# Patient Record
Sex: Female | Born: 1995 | Race: Black or African American | Hispanic: No | Marital: Single | Smoking: Former smoker
Health system: Southern US, Community
[De-identification: ages and names within clinical notes are randomized; demographics above are authoritative.]

## PROBLEM LIST (undated history)

## (undated) DIAGNOSIS — N760 Acute vaginitis: Secondary | ICD-10-CM

## (undated) DIAGNOSIS — B9689 Other specified bacterial agents as the cause of diseases classified elsewhere: Secondary | ICD-10-CM

## (undated) HISTORY — PX: NO PAST SURGERIES: SHX2092

---

## 2015-11-04 ENCOUNTER — Emergency Department (HOSPITAL_COMMUNITY): Payer: BLUE CROSS/BLUE SHIELD

## 2015-11-04 ENCOUNTER — Encounter (HOSPITAL_COMMUNITY): Payer: Self-pay | Admitting: Emergency Medicine

## 2015-11-04 ENCOUNTER — Emergency Department (HOSPITAL_COMMUNITY)
Admission: EM | Admit: 2015-11-04 | Discharge: 2015-11-04 | Disposition: A | Discharge status: Home / Self Care | Payer: BLUE CROSS/BLUE SHIELD | Attending: Emergency Medicine | Admitting: Emergency Medicine

## 2015-11-04 DIAGNOSIS — S8012XD Contusion of left lower leg, subsequent encounter: Secondary | ICD-10-CM

## 2015-11-04 DIAGNOSIS — F17200 Nicotine dependence, unspecified, uncomplicated: Secondary | ICD-10-CM | POA: Diagnosis not present

## 2015-11-04 DIAGNOSIS — W501XXD Accidental kick by another person, subsequent encounter: Secondary | ICD-10-CM | POA: Insufficient documentation

## 2015-11-04 MED ORDER — OXYCODONE-ACETAMINOPHEN 5-325 MG PO TABS: 1.0000 | ORAL_TABLET | Freq: Three times a day (TID) | ORAL | Status: DC | PRN

## 2015-11-04 MED ORDER — OXYCODONE-ACETAMINOPHEN 5-325 MG PO TABS
1.0000 | ORAL_TABLET | Freq: Once | ORAL | Status: AC
  Administered 2015-11-04: 1 via ORAL
  Filled 2015-11-04: qty 1
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
## 2015-11-04 NOTE — ED Notes (Signed)
PA at bedside.

## 2015-11-04 NOTE — ED Provider Notes (Signed)
CSN: [id number]     Arrival date & time 11/04/15  1850 History  By signing my name below, I, Candace Curtis, attest that this documentation has been prepared under the direction and in the presence of Candace Piliyler Sion Reinders, PA-C. Electronically Signed: Doreatha MartinEva Curtis, ED Scribe. 11/04/2015. 8:17 PM.    Chief Complaint  Patient presents with  . Leg Pain  . Leg Injury   The history is provided by the patient. No language interpreter was used.   HPI Comments: Candace Curtis is a 20 y.o. female who presents to the Emergency Department complaining of persistent 10/10 left calf pain and ecchymosis s/p injury one week ago. Pt states she was kicked in the leg while playing soccer, causing her injury. Denies LOC, head injury, falls, additional injuries. Denies recent re-injury, fall or trauma. Pt notes that she has been ambulatory without difficulty since injury. She states she was evaluated by the ER in [place] after her injury where she had negative XR. Pt states since being discharged, her pain has persisted and worsened. She reports that her pain is worsened with weight-bearing, ambulation and improved with RICE. Denies knee pain, numbness, weakness, paresthesia.   History reviewed. No pertinent past medical history. History reviewed. No pertinent past surgical history. History reviewed. No pertinent family history. Social History  Substance Use Topics  . Smoking status: Current Some Day Smoker  . Smokeless tobacco: None  . Alcohol Use: Yes   OB History    No data available     Review of Systems A complete 10 system review of systems was obtained and all systems are negative except as noted in the HPI and PMH.    Allergies  Review of patient's allergies indicates no known allergies.  Home Medications   Prior to Admission medications   Not on File   BP 114/68 mmHg  Pulse 71  Temp(Src) 98.6 F (37 C) (Oral)  Resp 16  Ht 5\' 2"  (1.575 m)  Wt 120 lb (54.432 kg)  BMI 21.94 kg/m2  SpO2 100%  LMP  11/04/2015 (Exact Date)   Physical Exam  Constitutional: She is oriented to person, place, and time. She appears well-developed and well-nourished.  HENT:  Head: Normocephalic and atraumatic.  Eyes: Conjunctivae are normal.  Cardiovascular: Normal rate.   Pulmonary/Chest: Effort normal. No respiratory distress.  Abdominal: She exhibits no distension.  Musculoskeletal: Normal range of motion. She exhibits tenderness.  Tenderness and ecchymosis to the midshaft of the medial aspect of the left tibia. No obvious deformity. FROM. Distal pulses appreciated.   Neurological: She is alert and oriented to person, place, and time. Coordination normal.  Strength and sensation equal and intact bilaterally throughout the upper and lower extremities.Normal gait. Coordination intact.    Skin: Skin is warm and dry.  Psychiatric: She has a normal mood and affect. Her behavior is normal.  Nursing note and vitals reviewed.  ED Course  Procedures (including critical care time) DIAGNOSTIC STUDIES: Oxygen Saturation is 100% on RA, normal by my interpretation.    COORDINATION OF CARE: 8:07 PM Discussed treatment plan with pt at bedside which includes XR and pt agreed to plan. Pt has crutches.   Imaging Review Dg Tibia/fibula Left  11/04/2015  CLINICAL DATA:  Soccer injury with distal left lower extremity pain. EXAM: LEFT TIBIA AND FIBULA - 2 VIEW COMPARISON:  None. FINDINGS: Soft tissue swelling medial to the mid to distal left tibial shaft. No fracture. No focal osseous lesions. No evidence of malalignment. IMPRESSION: Soft tissue swelling  medial to the mid to distal left tibial shaft, with no fracture. Electronically Signed   By: Delbert Phenix M.D.   On: 11/04/2015 20:11   Dg Foot Complete Left  11/04/2015  CLINICAL DATA:  Left foot pain after recent soccer injury. EXAM: LEFT FOOT - COMPLETE 3+ VIEW COMPARISON:  None. FINDINGS: There is no evidence of fracture or dislocation. There is no evidence of  arthropathy or other focal bone abnormality. Soft tissues are unremarkable. IMPRESSION: Negative. Electronically Signed   By: Delbert Phenix M.D.   On: 11/04/2015 20:12   I have personally reviewed and evaluated these images as part of my medical decision-making.  MDM  I have reviewed and evaluated the relevant imaging studies. I have reviewed the relevant previous healthcare records. I obtained HPI from historian. Patient discussed with supervising physician  ED Course:  Assessment: Patient X-Ray negative for obvious fracture or dislocation. Given intact motor/sensory, good distal pulses and pt ability to ambulate, low suspicion for acute compartment syndrome. Pt does have hematoma. Pt advised to follow up with PCP. Patient given ace wrap while in ED, conservative therapy recommended and discussed. Advised pt to continue use of crutches. Patient will be discharged home & is agreeable with above plan. Returns precautions discussed. Pt appears safe for discharge.   Disposition/Plan:  DC home Additional Verbal discharge instructions given and discussed with patient.  Pt Instructed to f/u with PCP in the next week for evaluation and treatment of symptoms. Return precautions given Pt acknowledges and agrees with plan  Supervising Physician Rolland Porter, MD   Final diagnoses:  Hematoma of leg, left, subsequent encounter    I personally performed the services described in this documentation, which was scribed in my presence. The recorded information has been reviewed and is accurate.   Candace Pili, PA-C 11/04/15 2033  Rolland Porter, MD 11/17/15 2049
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
## 2016-07-03 ENCOUNTER — Emergency Department (HOSPITAL_COMMUNITY)
Admission: EM | Admit: 2016-07-03 | Discharge: 2016-07-03 | Disposition: A | Discharge status: Home / Self Care | Payer: BLUE CROSS/BLUE SHIELD | Attending: Emergency Medicine | Admitting: Emergency Medicine

## 2016-07-03 ENCOUNTER — Encounter (HOSPITAL_COMMUNITY): Payer: Self-pay | Admitting: Nurse Practitioner

## 2016-07-03 DIAGNOSIS — Y92410 Unspecified street and highway as the place of occurrence of the external cause: Secondary | ICD-10-CM | POA: Diagnosis not present

## 2016-07-03 DIAGNOSIS — Y939 Activity, unspecified: Secondary | ICD-10-CM | POA: Insufficient documentation

## 2016-07-03 DIAGNOSIS — S3992XA Unspecified injury of lower back, initial encounter: Secondary | ICD-10-CM | POA: Diagnosis present

## 2016-07-03 DIAGNOSIS — S39012A Strain of muscle, fascia and tendon of lower back, initial encounter: Secondary | ICD-10-CM | POA: Diagnosis not present

## 2016-07-03 DIAGNOSIS — Y999 Unspecified external cause status: Secondary | ICD-10-CM | POA: Insufficient documentation

## 2016-07-03 DIAGNOSIS — F17200 Nicotine dependence, unspecified, uncomplicated: Secondary | ICD-10-CM | POA: Insufficient documentation

## 2016-07-03 DIAGNOSIS — T148XXA Other injury of unspecified body region, initial encounter: Secondary | ICD-10-CM

## 2016-07-03 MED ORDER — METHOCARBAMOL 500 MG PO TABS: 500.0000 mg | ORAL_TABLET | Freq: Two times a day (BID) | ORAL | 0 refills | Status: DC | PRN

## 2016-07-03 MED ORDER — NAPROXEN 500 MG PO TABS: 500.0000 mg | ORAL_TABLET | Freq: Two times a day (BID) | ORAL | 0 refills | Status: DC | PRN

## 2016-07-03 NOTE — ED Notes (Signed)
PT is in stable condition upon d/c and ambulates from ED. 
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
## 2016-08-23 ENCOUNTER — Emergency Department (HOSPITAL_COMMUNITY)
Admission: EM | Admit: 2016-08-23 | Discharge: 2016-08-23 | Disposition: A | Discharge status: Home / Self Care | Payer: BLUE CROSS/BLUE SHIELD | Attending: Emergency Medicine | Admitting: Emergency Medicine

## 2016-08-23 ENCOUNTER — Encounter (HOSPITAL_COMMUNITY): Payer: Self-pay | Admitting: Emergency Medicine

## 2016-08-23 DIAGNOSIS — S30860A Insect bite (nonvenomous) of lower back and pelvis, initial encounter: Secondary | ICD-10-CM | POA: Insufficient documentation

## 2016-08-23 DIAGNOSIS — S60861A Insect bite (nonvenomous) of right wrist, initial encounter: Secondary | ICD-10-CM | POA: Diagnosis not present

## 2016-08-23 DIAGNOSIS — Y999 Unspecified external cause status: Secondary | ICD-10-CM | POA: Insufficient documentation

## 2016-08-23 DIAGNOSIS — F17200 Nicotine dependence, unspecified, uncomplicated: Secondary | ICD-10-CM | POA: Diagnosis not present

## 2016-08-23 DIAGNOSIS — Y939 Activity, unspecified: Secondary | ICD-10-CM | POA: Insufficient documentation

## 2016-08-23 DIAGNOSIS — S80862A Insect bite (nonvenomous), left lower leg, initial encounter: Secondary | ICD-10-CM | POA: Insufficient documentation

## 2016-08-23 DIAGNOSIS — W57XXXA Bitten or stung by nonvenomous insect and other nonvenomous arthropods, initial encounter: Secondary | ICD-10-CM | POA: Insufficient documentation

## 2016-08-23 DIAGNOSIS — Y92009 Unspecified place in unspecified non-institutional (private) residence as the place of occurrence of the external cause: Secondary | ICD-10-CM | POA: Diagnosis not present

## 2016-08-23 MED ORDER — TRIAMCINOLONE ACETONIDE 0.1 % EX CREA: 1.0000 "application " | TOPICAL_CREAM | Freq: Two times a day (BID) | CUTANEOUS | 0 refills | Status: DC

## 2016-08-23 MED ORDER — HYDROXYZINE HCL 10 MG PO TABS: 10.0000 mg | ORAL_TABLET | Freq: Three times a day (TID) | ORAL | 0 refills | Status: DC | PRN

## 2016-08-23 MED ORDER — DIPHENHYDRAMINE HCL 25 MG PO CAPS
50.0000 mg | ORAL_CAPSULE | Freq: Once | ORAL | Status: AC
  Administered 2016-08-23: 50 mg via ORAL
  Filled 2016-08-23: qty 2

## 2016-08-23 MED ORDER — TRIAMCINOLONE 0.1 % CREAM:EUCERIN CREAM 1:1: 1.0000 "application " | TOPICAL_CREAM | Freq: Three times a day (TID) | CUTANEOUS | 0 refills | Status: DC

## 2016-08-23 MED ORDER — CETIRIZINE HCL 10 MG PO TABS: 10.0000 mg | ORAL_TABLET | Freq: Every day | ORAL | 0 refills | Status: DC

## 2016-08-23 MED ORDER — TRIAMCINOLONE 0.1 % CREAM:EUCERIN CREAM 1:1: TOPICAL_CREAM | Freq: Once | CUTANEOUS | Status: DC

## 2016-08-23 NOTE — ED Triage Notes (Signed)
Pt reports she has had bug bites on wrist, legs, and side for past 2 days. Bug bites itch.
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
## 2016-11-04 ENCOUNTER — Emergency Department (HOSPITAL_COMMUNITY)
Admission: EM | Admit: 2016-11-04 | Discharge: 2016-11-05 | Disposition: A | Discharge status: Home / Self Care | Payer: BLUE CROSS/BLUE SHIELD | Attending: Emergency Medicine | Admitting: Emergency Medicine

## 2016-11-04 ENCOUNTER — Encounter (HOSPITAL_COMMUNITY): Payer: Self-pay | Admitting: Emergency Medicine

## 2016-11-04 DIAGNOSIS — R1031 Right lower quadrant pain: Secondary | ICD-10-CM | POA: Insufficient documentation

## 2016-11-04 DIAGNOSIS — F17200 Nicotine dependence, unspecified, uncomplicated: Secondary | ICD-10-CM | POA: Diagnosis not present

## 2016-11-04 DIAGNOSIS — R51 Headache: Secondary | ICD-10-CM | POA: Insufficient documentation

## 2016-11-04 DIAGNOSIS — Z79899 Other long term (current) drug therapy: Secondary | ICD-10-CM | POA: Diagnosis not present

## 2016-11-04 LAB — URINALYSIS, ROUTINE W REFLEX MICROSCOPIC
Bilirubin Urine: NEGATIVE
GLUCOSE, UA: NEGATIVE mg/dL
Ketones, ur: NEGATIVE mg/dL
Leukocytes, UA: NEGATIVE
NITRITE: NEGATIVE
Protein, ur: NEGATIVE mg/dL
SPECIFIC GRAVITY, URINE: 1.026 (ref 1.005–1.030)
pH: 6 (ref 5.0–8.0)

## 2016-11-04 LAB — CBC WITH DIFFERENTIAL/PLATELET
BASOS ABS: 0 10*3/uL (ref 0.0–0.1)
BASOS PCT: 0 %
Eosinophils Absolute: 0.1 10*3/uL (ref 0.0–0.7)
Eosinophils Relative: 1 %
HEMATOCRIT: 37.7 % (ref 36.0–46.0)
Hemoglobin: 13 g/dL (ref 12.0–15.0)
Lymphocytes Relative: 28 %
Lymphs Abs: 1.8 10*3/uL (ref 0.7–4.0)
MCH: 31.4 pg (ref 26.0–34.0)
MCHC: 34.5 g/dL (ref 30.0–36.0)
MCV: 91.1 fL (ref 78.0–100.0)
MONO ABS: 0.6 10*3/uL (ref 0.1–1.0)
Monocytes Relative: 8 %
Neutro Abs: 4.2 10*3/uL (ref 1.7–7.7)
Neutrophils Relative %: 63 %
PLATELETS: 257 10*3/uL (ref 150–400)
RBC: 4.14 MIL/uL (ref 3.87–5.11)
RDW: 12.8 % (ref 11.5–15.5)
WBC: 6.6 10*3/uL (ref 4.0–10.5)

## 2016-11-04 LAB — BASIC METABOLIC PANEL
Anion gap: 8 (ref 5–15)
BUN: 11 mg/dL (ref 6–20)
CALCIUM: 8.9 mg/dL (ref 8.9–10.3)
CO2: 27 mmol/L (ref 22–32)
CREATININE: 0.63 mg/dL (ref 0.44–1.00)
Chloride: 102 mmol/L (ref 101–111)
Glucose, Bld: 87 mg/dL (ref 65–99)
Potassium: 3.7 mmol/L (ref 3.5–5.1)
SODIUM: 137 mmol/L (ref 135–145)

## 2016-11-04 MED ORDER — PROMETHAZINE HCL 25 MG/ML IJ SOLN
25.0000 mg | Freq: Once | INTRAMUSCULAR | Status: AC
  Administered 2016-11-05: 25 mg via INTRAMUSCULAR
  Filled 2016-11-04: qty 1

## 2016-11-04 MED ORDER — KETOROLAC TROMETHAMINE 60 MG/2ML IM SOLN
60.0000 mg | Freq: Once | INTRAMUSCULAR | Status: AC
  Administered 2016-11-05: 60 mg via INTRAMUSCULAR
  Filled 2016-11-04: qty 2

## 2016-11-04 MED ORDER — IBUPROFEN 400 MG PO TABS
ORAL_TABLET | ORAL | Status: AC
  Filled 2016-11-04: qty 1

## 2016-11-04 MED ORDER — IBUPROFEN 400 MG PO TABS
400.0000 mg | ORAL_TABLET | Freq: Once | ORAL | Status: AC | PRN
  Administered 2016-11-04: 400 mg via ORAL

## 2016-11-04 NOTE — ED Triage Notes (Signed)
Pt c/o constant right lower pelvis pain for the past few weeks getting worse today with HA, nausea and vomiting denies any vaginal discharge or urinary symptoms.

## 2016-11-04 NOTE — ED Notes (Signed)
Patient taken to CT.

## 2016-11-04 NOTE — ED Notes (Signed)
Patient walked indecently to the room.  No signs of distress noted.  Patient states that her back pain has gotten worse.

## 2016-11-04 NOTE — ED Provider Notes (Signed)
[facility] Provider Note   CSN: [id number] Arrival date & time: 11/04/16  1930  By signing my name below, I, Modena Jansky, attest that this documentation has been prepared under the direction and in the presence of Geoffery Lyons, MD. Electronically Signed: Modena Jansky, Scribe. 11/04/2016. 11:43 PM.  History   Chief Complaint Chief Complaint  Patient presents with  . Pelvic Pain  . Headache   The history is provided by the patient. No language interpreter was used.  Abdominal Pain   This is a new problem. The problem occurs constantly. The problem has been gradually worsening. The pain is located in the epigastric region. Associated symptoms include headaches. Pertinent negatives include fever and dysuria. Nothing aggravates the symptoms. Nothing relieves the symptoms. Past workup does not include surgery.   HPI Comments: Candace Curtis is a 21 y.o. female who presents to the Emergency Department complaining of intermittent moderate right-sided abdominal pain that started about a week ago. She states her pain worsened today so she came to the ED. She describes the pain as a sharp, dull, and cramping sensation. She reports associated back pain, dyspareunia (onset 2 weeks ago), and headache (onset today, radiating to right eye region). Her LMP was 2 weeks ago and only lasted 2 days for the first time in 2 months. She has a prior hx of headaches. Denies any hx of abdominal surgeries, fever, dysuria, vaginal bleeding/discharge, or other complaints at this time.  History reviewed. No pertinent past medical history.  There are no active problems to display for this patient.   History reviewed. No pertinent surgical history.  OB History    No data available       Home Medications    Prior to Admission medications   Medication Sig Start Date End Date Taking? Authorizing Provider  hydrOXYzine (ATARAX/VISTARIL) 10 MG tablet Take 1 tablet (10 mg total) by mouth 3 (three) times  daily as needed. 08/23/16   Georgiana Shore, PA-C  methocarbamol (ROBAXIN) 500 MG tablet Take 1 tablet (500 mg total) by mouth 2 (two) times daily as needed for muscle spasms. 07/03/16   Chase Picket Ward, PA-C  naproxen (NAPROSYN) 500 MG tablet Take 1 tablet (500 mg total) by mouth 2 (two) times daily as needed. 07/03/16   Chase Picket Ward, PA-C  oxyCODONE-acetaminophen (PERCOCET/ROXICET) 5-325 MG tablet Take 1 tablet by mouth every 8 (eight) hours as needed for severe pain. 11/04/15   Audry Pili, PA-C  triamcinolone cream (KENALOG) 0.1 % Apply 1 application topically 2 (two) times daily. 08/23/16   Georgiana Shore, PA-C    Family History History reviewed. No pertinent family history.  Social History Social History  Substance Use Topics  . Smoking status: Current Some Day Smoker  . Smokeless tobacco: Never Used  . Alcohol use Yes     Allergies   Patient has no known allergies.   Review of Systems Review of Systems  Constitutional: Negative for fever.  Gastrointestinal: Positive for abdominal pain (Right-sided).  Genitourinary: Positive for dyspareunia and pelvic pain. Negative for dysuria, vaginal bleeding and vaginal discharge.  Musculoskeletal: Positive for back pain.  Neurological: Positive for headaches.  All other systems reviewed and are negative.    Physical Exam Updated Vital Signs BP (!) 115/57 (BP Location: Right Arm)   Pulse 86   Temp 98.3 F (36.8 C) (Oral)   Resp (!) 22   Ht  (1.575 m)   Wt 130 lb (59 kg)   LMP 10/27/2016  SpO2 99%   BMI 23.78 kg/m   Physical Exam  Constitutional: She is oriented to person, place, and time. She appears well-developed and well-nourished. No distress.  HENT:  Head: Normocephalic and atraumatic.  Eyes: Conjunctivae and EOM are normal.  Neck: Normal range of motion. Neck supple.  Cardiovascular: Normal rate, regular rhythm and normal heart sounds.   Pulmonary/Chest: Effort normal and breath sounds normal.    Abdominal: Soft. She exhibits no distension. There is tenderness. There is no rebound and no guarding.  Mild TTP in the RLQ.   Musculoskeletal: Normal range of motion.  Neurological: She is alert and oriented to person, place, and time. No cranial nerve deficit. She exhibits normal muscle tone. Coordination normal.  Skin: Skin is warm and dry.  Psychiatric: She has a normal mood and affect. Judgment normal.  Nursing note and vitals reviewed.    ED Treatments / Results  DIAGNOSTIC STUDIES: Oxygen Saturation is 99% on RA, normal by my interpretation.    COORDINATION OF CARE: 11:47 PM- Pt advised of plan for treatment and pt agrees.  Labs (all labs ordered are listed, but only abnormal results are displayed) Labs Reviewed  URINALYSIS, ROUTINE W REFLEX MICROSCOPIC - Abnormal; Notable for the following:       Result Value   Hgb urine dipstick SMALL (*)    Bacteria, UA RARE (*)    Squamous Epithelial / LPF 0-5 (*)    All other components within normal limits  CBC WITH DIFFERENTIAL/PLATELET  BASIC METABOLIC PANEL  POC URINE PREG, ED    EKG  EKG Interpretation None       Radiology Ct Head Wo Contrast  Result Date: 11/05/2016 CLINICAL DATA:  Nausea, vomiting x2 weeks with headache EXAM: CT HEAD WITHOUT CONTRAST TECHNIQUE: Contiguous axial images were obtained from the base of the skull through the vertex without intravenous contrast. COMPARISON:  None. FINDINGS: Brain: No evidence of acute infarction, hemorrhage, hydrocephalus, extra-axial collection or mass lesion/mass effect. Vascular: No hyperdense vessel or unexpected calcification. Skull: Normal. Negative for fracture or focal lesion. Sinuses/Orbits: No acute finding. Other: None. IMPRESSION: No acute intracranial abnormality. Electronically Signed   By: Tollie Eth M.D.   On: 11/05/2016 00:11    Procedures Procedures (including critical care time)  Medications Ordered in ED Medications  ibuprofen (ADVIL,MOTRIN) 400 MG  tablet (not administered)  ibuprofen (ADVIL,MOTRIN) tablet 400 mg (400 mg Oral Given 11/04/16 2014)  ketorolac (TORADOL) injection 60 mg (60 mg Intramuscular Given 11/05/16 0012)  promethazine (PHENERGAN) injection 25 mg (25 mg Intramuscular Given 11/05/16 0012)     Initial Impression / Assessment and Plan / ED Course  I have reviewed the triage vital signs and the nursing notes.  Pertinent labs & imaging results that were available during my care of the patient were reviewed by me and considered in my medical decision making (see chart for details).  Patient presents here with right-sided abdominal pain that has been ongoing for the past week. Her abdomen is benign, she is afebrile, and has no white count. I highly doubt appendicitis. She has undergone ultrasound which shows no evidence for ovarian cyst or other abnormality. There is good flow through the ovary, thus excluding torsion.  I see no indication for further workup at this time. She will be treated with pain medicine and is to follow-up as needed for any problems.  Final Clinical Impressions(s) / ED Diagnoses   Final diagnoses:  RLQ abdominal pain    New Prescriptions New Prescriptions  No medications on file   I personally performed the services described in this documentation, which was scribed in my presence. The recorded information has been reviewed and is accurate.       Geoffery Lyons, MD 11/05/16 [phone]

## 2016-11-05 ENCOUNTER — Emergency Department (HOSPITAL_COMMUNITY): Payer: BLUE CROSS/BLUE SHIELD

## 2016-11-05 ENCOUNTER — Encounter (HOSPITAL_COMMUNITY): Payer: Self-pay | Admitting: Radiology

## 2016-11-05 ENCOUNTER — Other Ambulatory Visit (HOSPITAL_COMMUNITY): Payer: Self-pay

## 2016-11-05 MED ORDER — TRAMADOL HCL 50 MG PO TABS: 50.0000 mg | ORAL_TABLET | Freq: Four times a day (QID) | ORAL | 0 refills | Status: DC | PRN

## 2016-11-05 NOTE — ED Notes (Signed)
Patient Alert and oriented X4. Stable and ambulatory. Patient verbalized understanding of the discharge instructions.  Patient belongings were taken by the patient.  

## 2016-11-05 NOTE — Discharge Instructions (Signed)
Tramadol as prescribed as needed for pain. ° °Return to the emergency department if you develop worsening pain, high fevers, bloody stools, or other new and concerning symptoms. °

## 2018-06-30 IMAGING — CT CT HEAD W/O CM
4 series · 17 of 47 positions shown, 19 images · non-contrast
Comparison: None.

CLINICAL DATA: Nausea, vomiting x2 weeks with headache

EXAM:
CT HEAD WITHOUT CONTRAST
TECHNIQUE: Contiguous axial images were obtained from the base of the skull
through the vertex without intravenous contrast.

[Series 3: head without · axial · non-contrast · 0.40mm/px · z∈[-67,+48]mm · 7 of 31 slices shown, 9 images]
[im 4/31  brain]
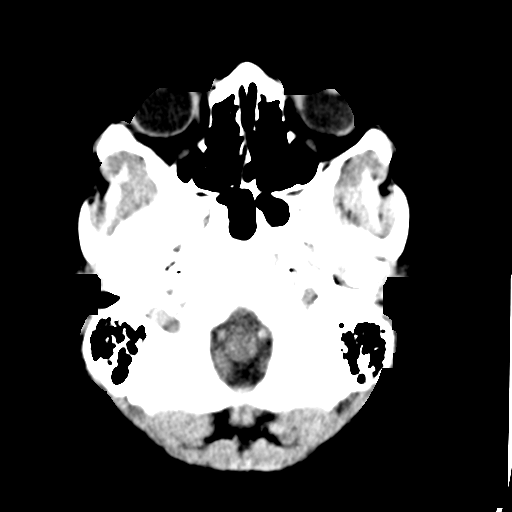
[im 4/31  bone]
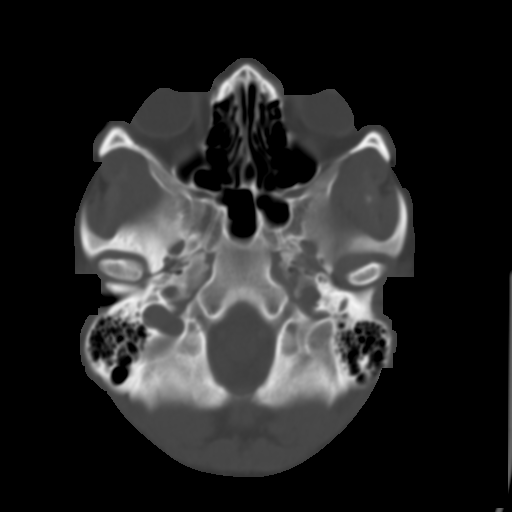
[im 8/31  brain]
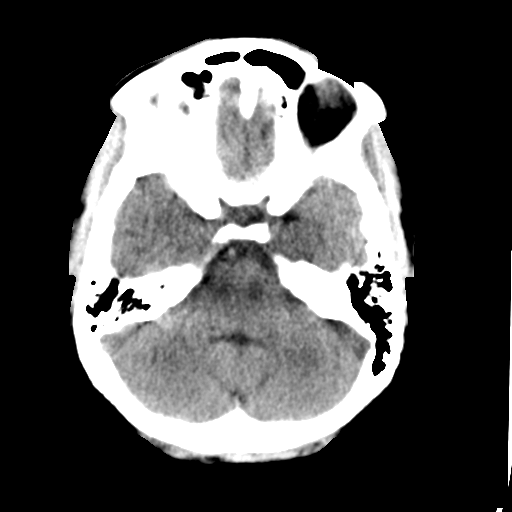
[im 12/31  brain]
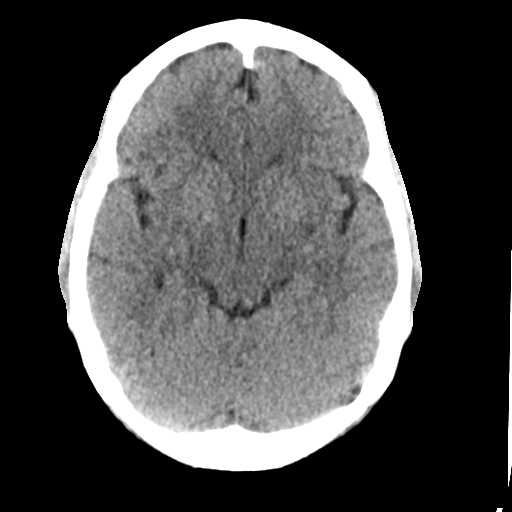
[im 16/31  brain]
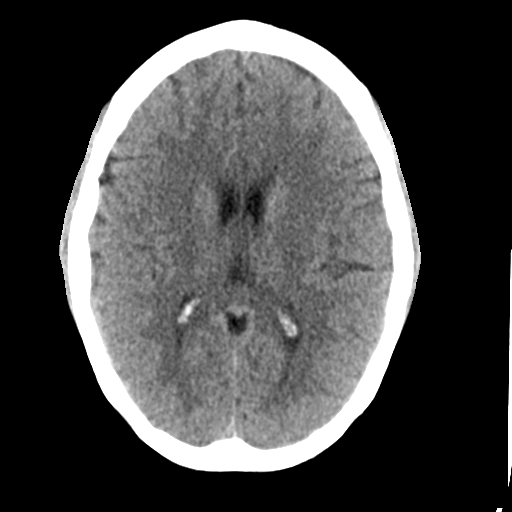
[im 19/31  brain]
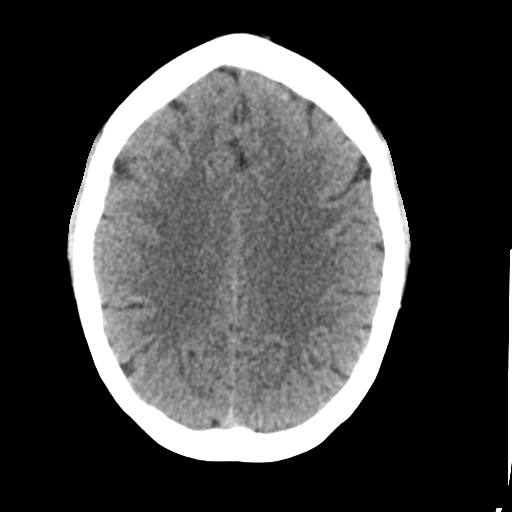
[im 19/31  bone]
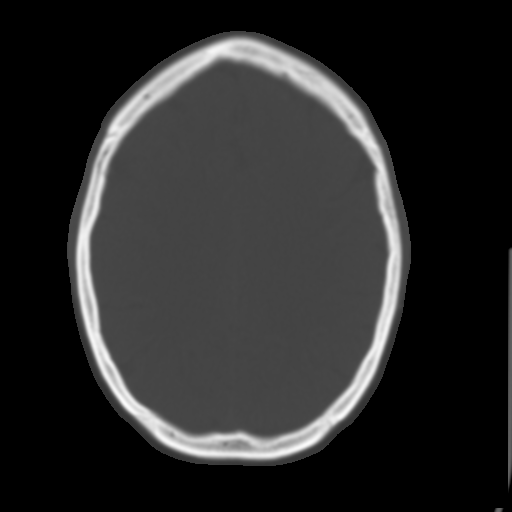
[im 23/31  brain]
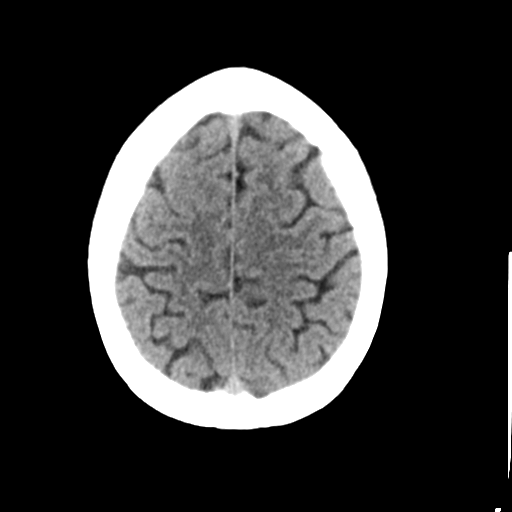
[im 27/31  brain]
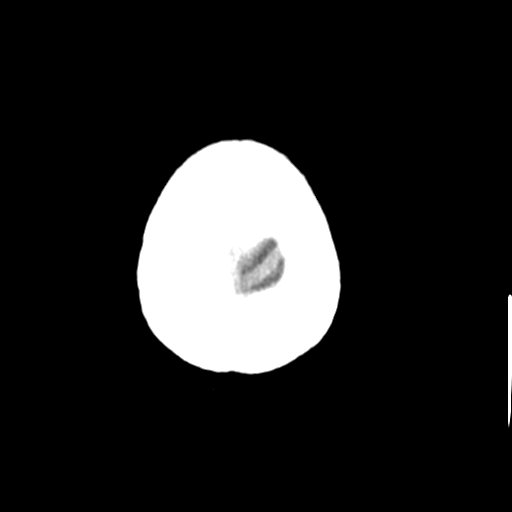

[Series 4: head bone · axial · 0.40mm/px · z∈[-68,-14]mm · 4 of 77 slices shown]
[im 8/77  bone]
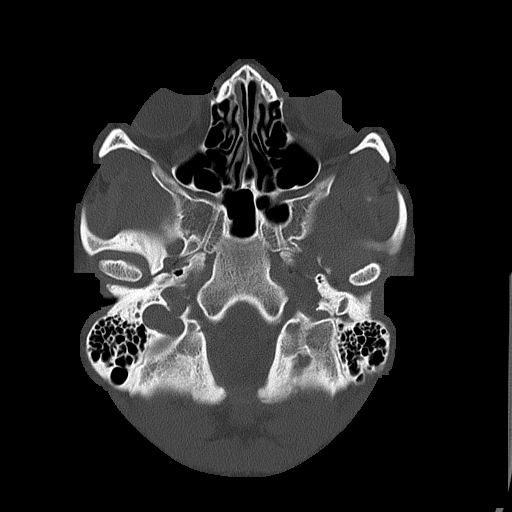
[im 16/77  bone]
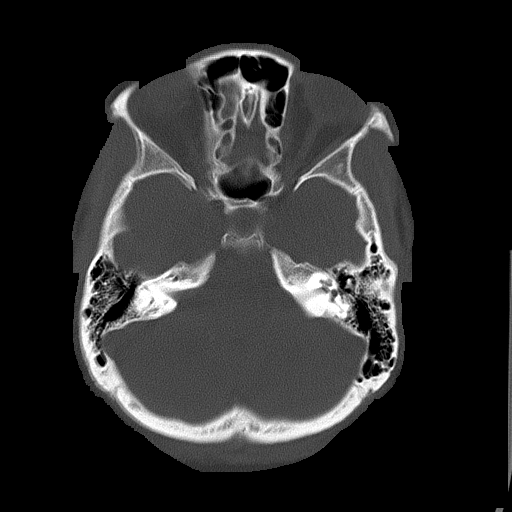
[im 23/77  bone]
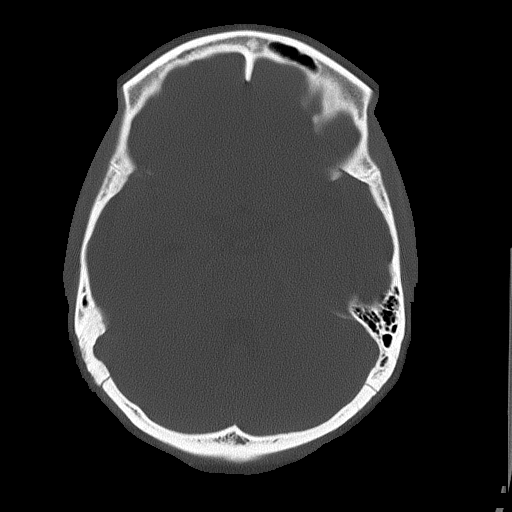
[im 35/77  bone]
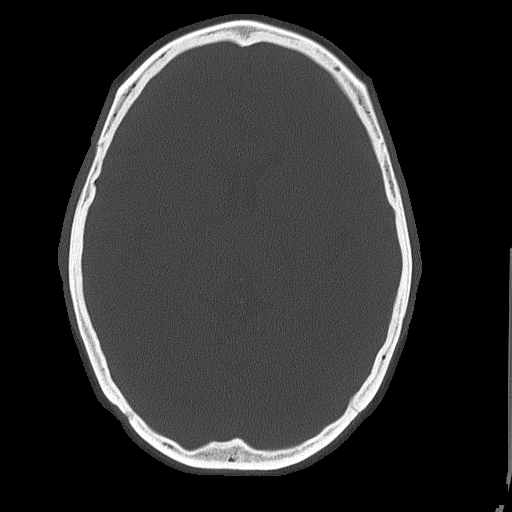

[Series 5: head without cor · coronal · non-contrast · 0.30mm/px · 3 of 66 slices shown]
[im 22/66  brain]
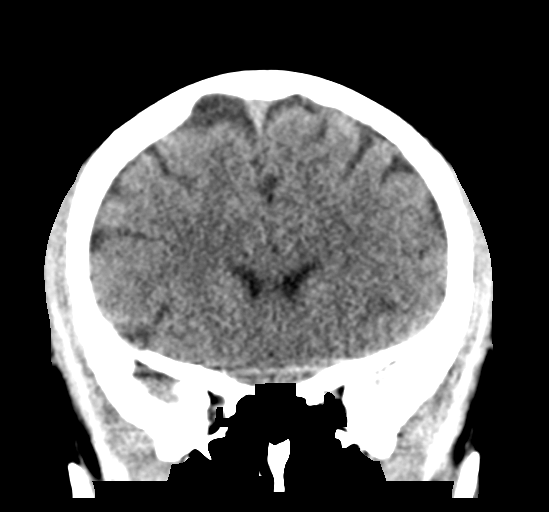
[im 29/66  brain]
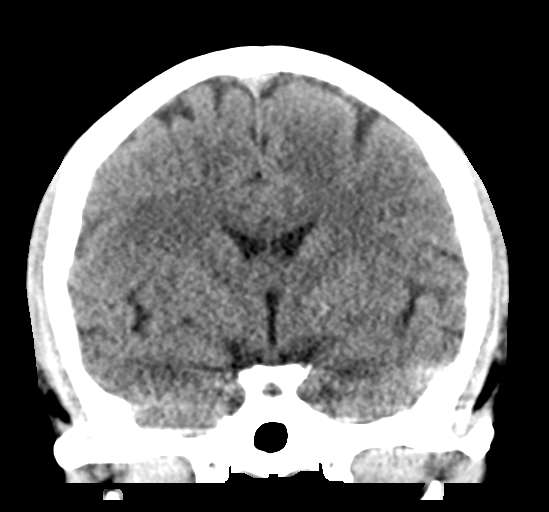
[im 37/66  brain]
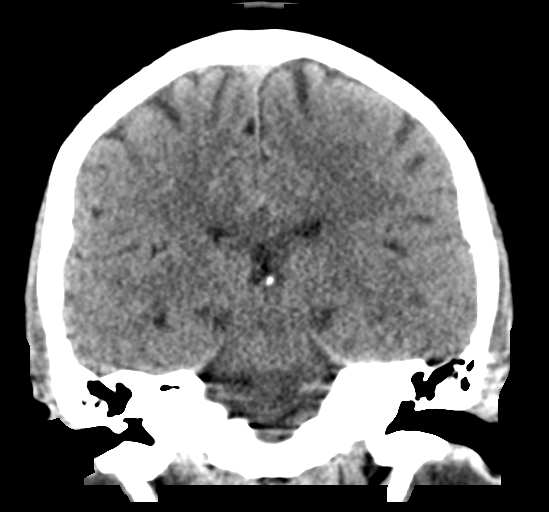

[Series 6: head without sag · sagittal · non-contrast · 0.29mm/px · 3 of 54 slices shown]
[im 18/54  brain]
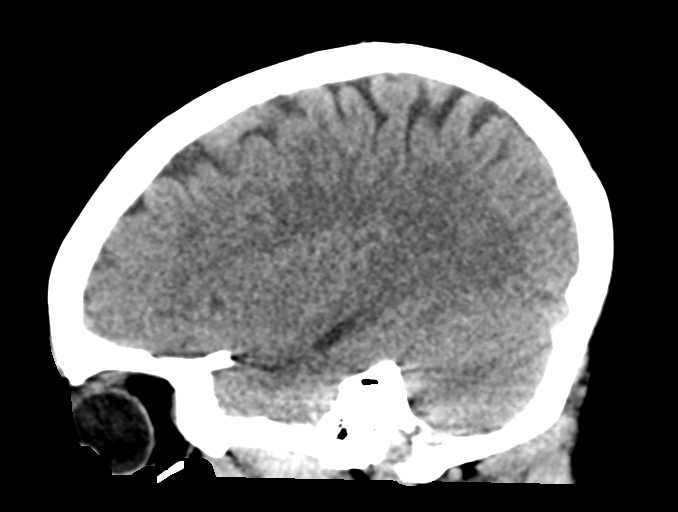
[im 27/54  brain]
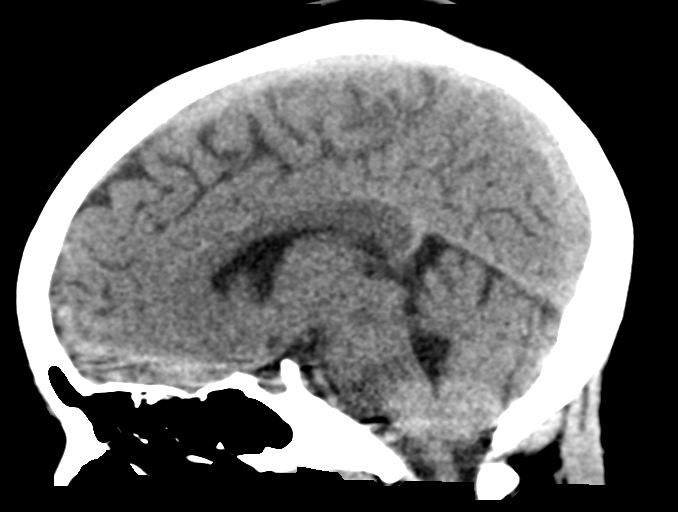
[im 36/54  brain]
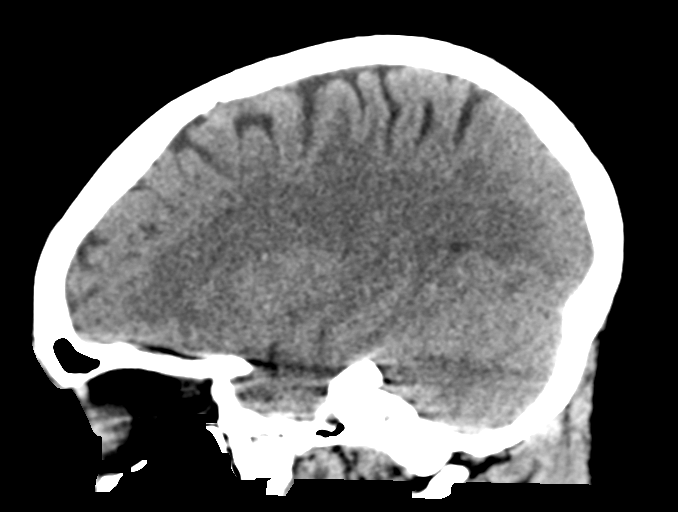

[17 of 47 positions shown; findings below may reference images not displayed]

FINDINGS: Brain: No evidence of acute infarction, hemorrhage, hydrocephalus,
extra-axial collection or mass lesion/mass effect.

Vascular: No hyperdense vessel or unexpected calcification.

Skull: Normal. Negative for fracture or focal lesion.

Sinuses/Orbits: No acute finding.

Other: None.
IMPRESSION: No acute intracranial abnormality.

## 2018-12-17 IMAGING — US US TRANSVAGINAL NON-OB
1 series · 13 of 25 positions shown · non-contrast
Comparison: None.

CLINICAL DATA: 20-year-old female with right lower quadrant
abdominal pain.

EXAM:
TRANSABDOMINAL AND TRANSVAGINAL ULTRASOUND OF PELVIS
DOPPLER ULTRASOUND OF OVARIES
TECHNIQUE: Both transabdominal and transvaginal ultrasound examinations of the
pelvis were performed. Transabdominal technique was performed for
global imaging of the pelvis including uterus, ovaries, adnexal
regions, and pelvic cul-de-sac.
It was necessary to proceed with endovaginal exam following the
transabdominal exam to visualize the endometrium and ovaries. Color
and duplex Doppler ultrasound was utilized to evaluate blood flow to
the ovaries.

[Series 1: us transvaginal non-ob · 0.21mm/px · 13 of 65 slices shown]
[im 1/65]
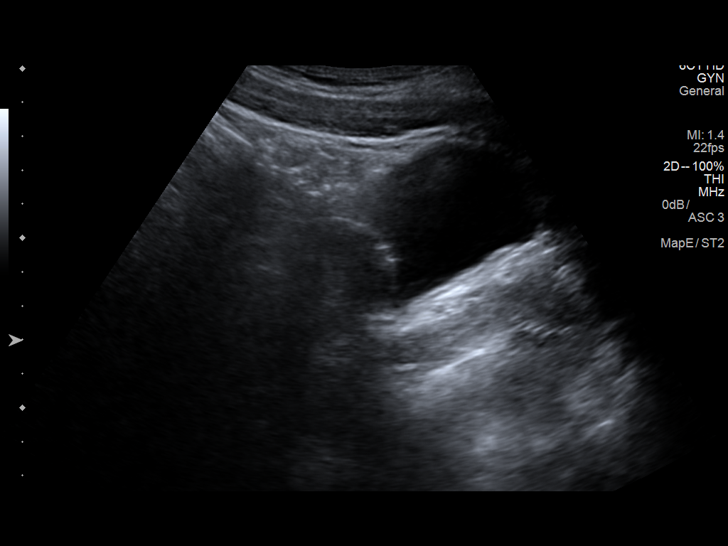
[im 6/65]
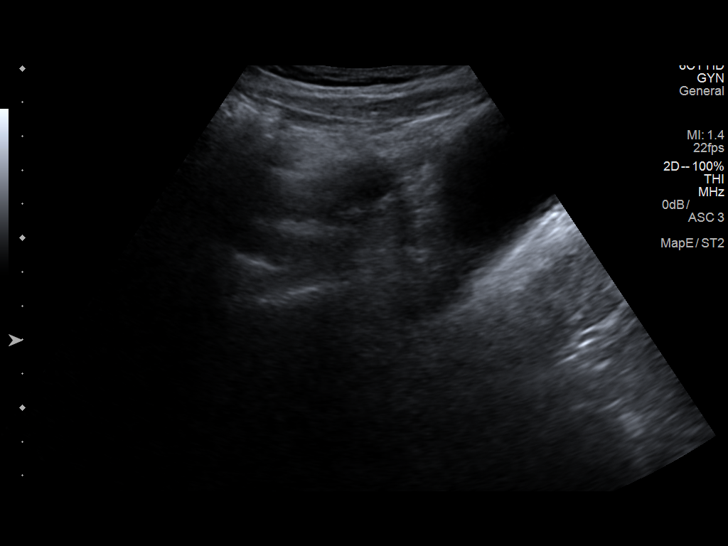
[im 11/65]
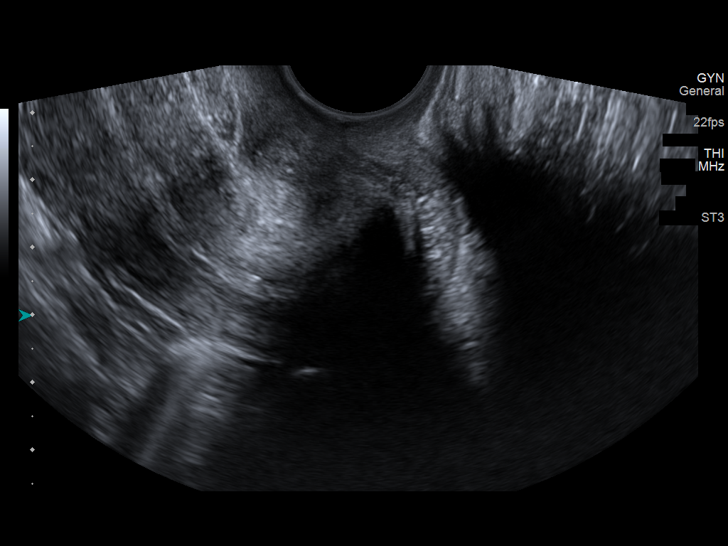
[im 17/65]
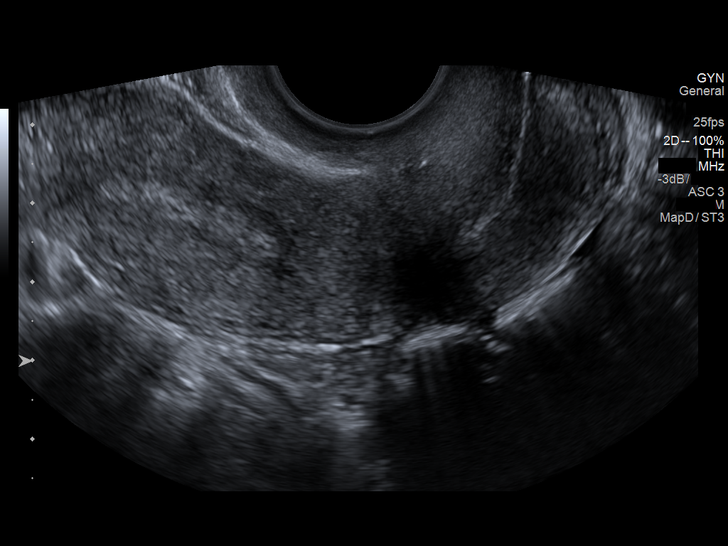
[im 22/65]
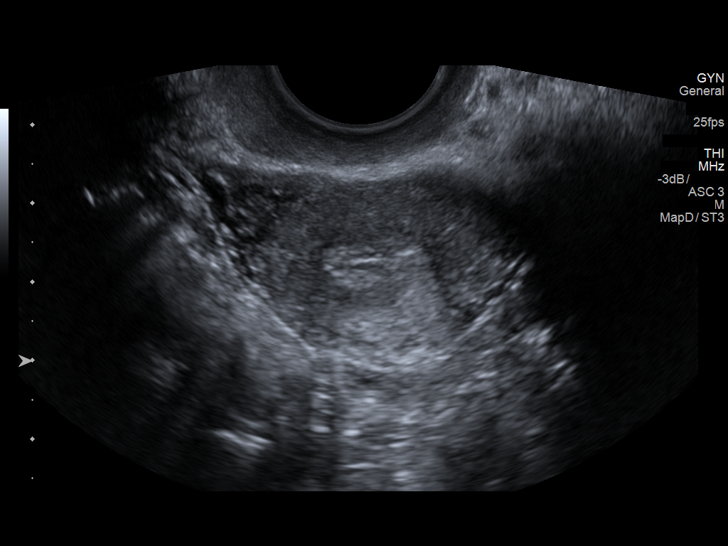
[im 27/65]
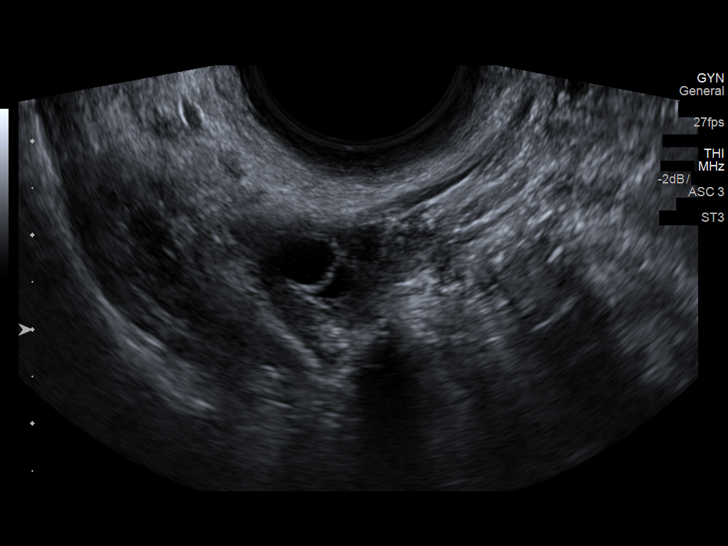
[im 33/65]
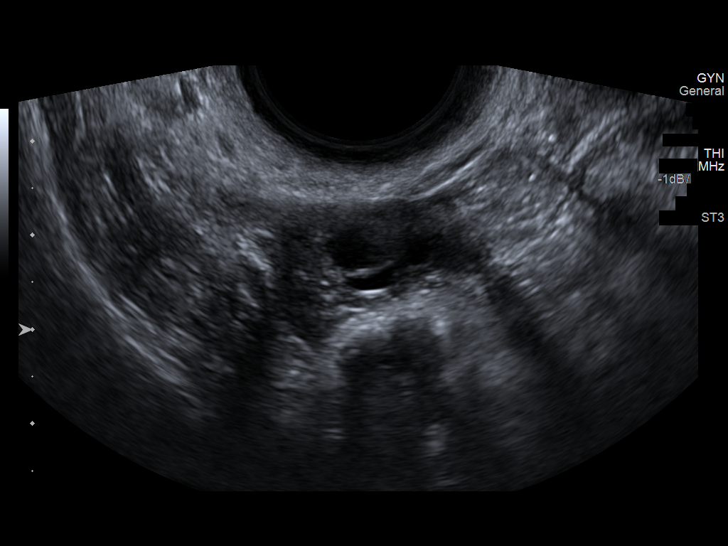
[im 38/65]
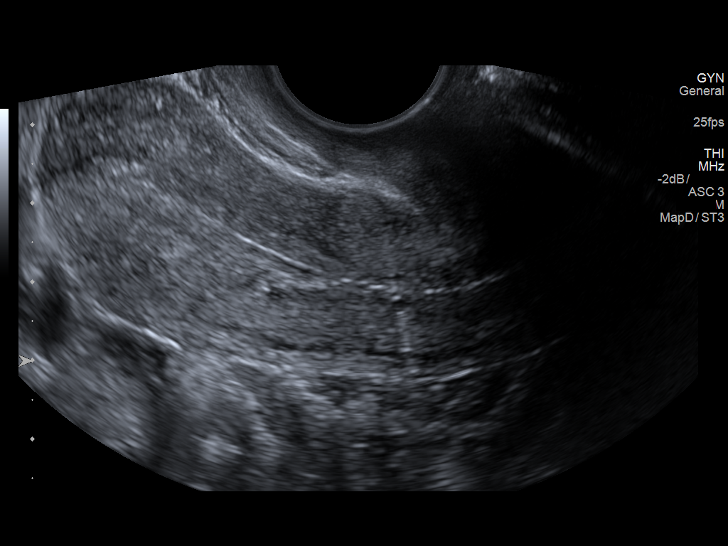
[im 43/65]
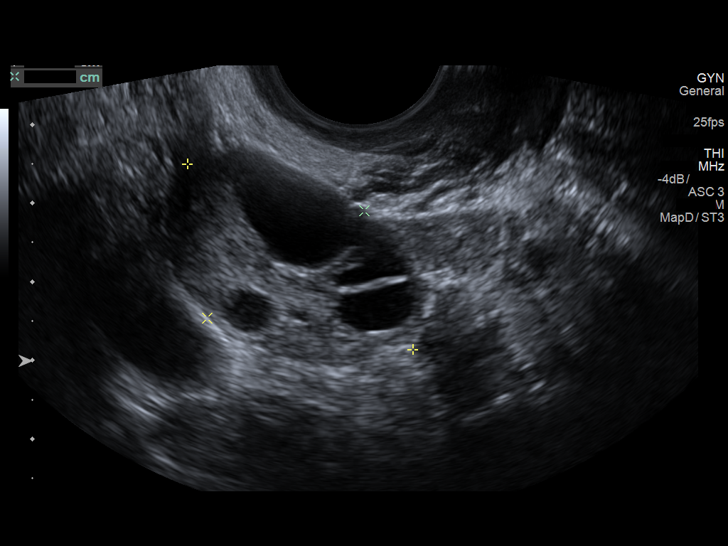
[im 49/65]
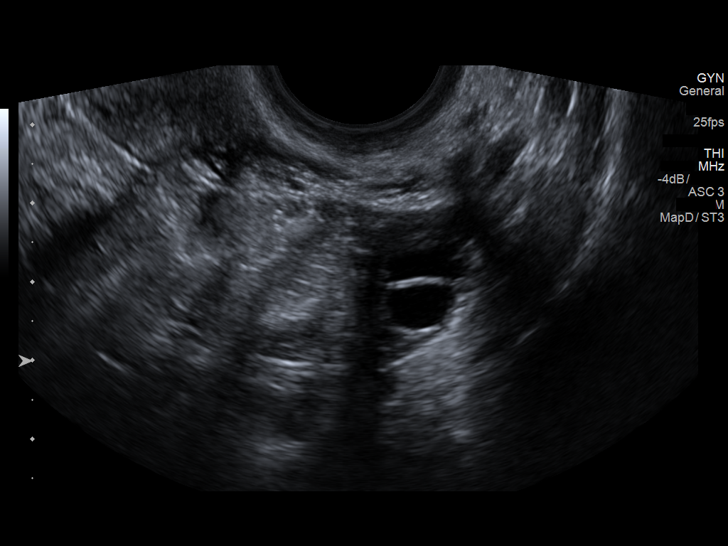
[im 54/65]
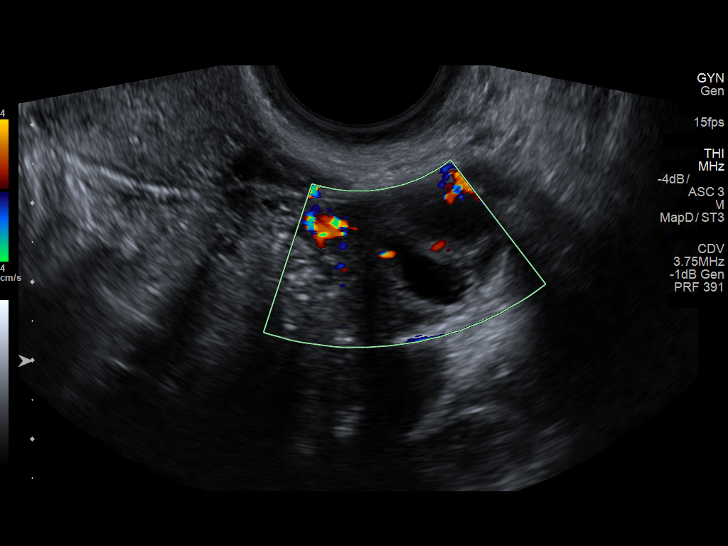
[im 59/65]
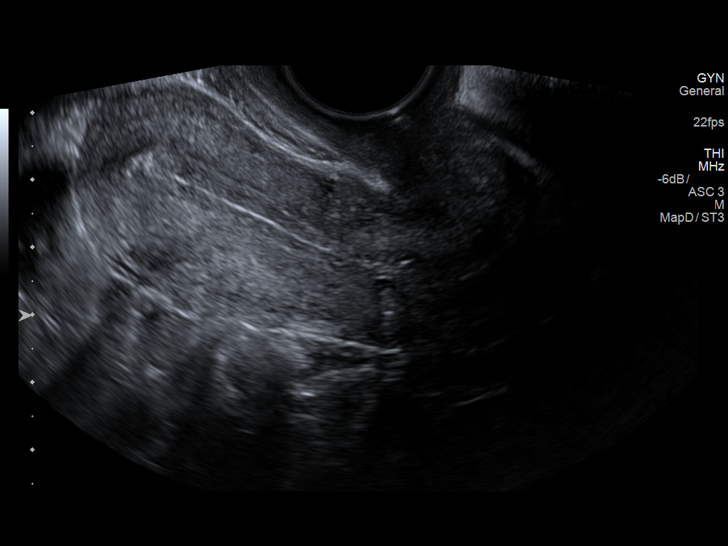
[im 65/65]
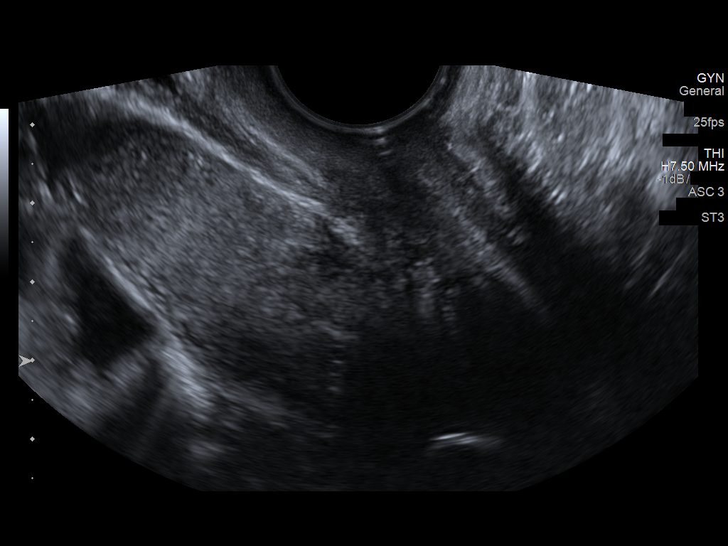

[13 of 25 positions shown; findings below may reference images not displayed]

FINDINGS: Uterus

Measurements: 5.8 x 2.7 x 3.5 cm. No fibroids or other mass
visualized.

Endometrium

Thickness: 6 mm.  No focal abnormality visualized.

Right ovary

Measurements: 2.4 x 2.1 x 2.5 cm. Normal appearance/no adnexal mass.

Left ovary

Measurements: 3.7 x 2.4 x 2.5 cm. Normal appearance/no adnexal mass.

Pulsed Doppler evaluation of both ovaries demonstrates normal
low-resistance arterial and venous waveforms.

Other findings

No abnormal free fluid.
IMPRESSION: Unremarkable pelvic ultrasound with Doppler detected flow to both
ovaries.

## 2019-04-22 ENCOUNTER — Other Ambulatory Visit: Payer: Self-pay

## 2019-04-22 ENCOUNTER — Ambulatory Visit (INDEPENDENT_AMBULATORY_CARE_PROVIDER_SITE_OTHER): Payer: BC Managed Care – PPO

## 2019-04-22 ENCOUNTER — Encounter: Payer: Self-pay | Admitting: Emergency Medicine

## 2019-04-22 ENCOUNTER — Ambulatory Visit
Admission: EM | Admit: 2019-04-22 | Discharge: 2019-04-22 | Disposition: A | Discharge status: Home / Self Care | Payer: BC Managed Care – PPO

## 2019-04-22 DIAGNOSIS — M79645 Pain in left finger(s): Secondary | ICD-10-CM

## 2019-04-22 MED ORDER — KETOROLAC TROMETHAMINE 10 MG PO TABS: 10.0000 mg | ORAL_TABLET | Freq: Three times a day (TID) | ORAL | 0 refills | Status: DC | PRN

## 2019-04-22 NOTE — Discharge Instructions (Signed)
It was very nice seeing you today in clinic. Thank you for entrusting me with your care.   Xray was normal. Use anti-inflammatories and ice as needed. If still having pain in 1 week, you will need to see orthopedics. I have provided you the name and office contact information for an excellent local provider. Call if you need to be seen.   If your symptoms/condition worsens, please seek follow up care either here or in the ER. Please remember, our [place] providers are "right here with you" when you need [place].   Again, it was my pleasure to take care of you today. Thank you for choosing our clinic. I hope that you start to feel better quickly.   Honor Loh, MSN, APRN, FNP-C, CEN Advanced Practice Provider [facility]

## 2019-04-22 NOTE — ED Triage Notes (Signed)
Patient c/o left middle finger pain that started about a month ago.  Patient states that her pain has gotten worse.  Patient denies injury or fall.

## 2019-04-22 NOTE — ED Provider Notes (Signed)
[place], [place]   Name: Candace Curtis DOB: Apr 22, 1996 MRN: [id number] CSN: [id number] PCP: System, Pcp Not In  Arrival date and time:  04/22/19 0944  Chief Complaint:  finger pain (left middle finger)   NOTE: Prior to seeing the patient today, I have reviewed the triage nursing documentation and vital signs. Clinical staff has updated patient's PMH/PSHx, current medication list, and drug allergies/intolerances to ensure comprehensive history available to assist in medical decision making.   History:   HPI: Sallyanne Kustermani Entwistle is a 23 y.o. female who presents today with complaints of pain to the 3rd digit on her LEFT hand for the last 1 month. Patient denies any injury. Patient states, "I thought maybe I just hit it, but it still hurts". She notes mild intermittent swelling; none today. Patient advises that the pain is mainly to the proximal phalanx of the digit, and notes that her pain is worse with movement. Patient has been taking APAP alone with no relief. She denies previous injuries to her LEFT hand; no previous surgeries.   History reviewed. No pertinent past medical history.  History reviewed. No pertinent surgical history.  Family History  Problem Relation Age of Onset  . Healthy Mother   . Healthy Father     Social History   Tobacco Use  . Smoking status: Former Games developermoker  . Smokeless tobacco: Never Used  Substance Use Topics  . Alcohol use: Yes  . Drug use: No    There are no active problems to display for this patient.   Home Medications:    Current Meds  Medication Sig  . etonogestrel-ethinyl estradiol (NUVARING) 0.12-0.015 MG/24HR vaginal ring Place 1 each vaginally every 28 (twenty-eight) days. Insert vaginally and leave in place for 3 consecutive weeks, then remove for 1 week.  . propranolol ER (INDERAL LA) 60 MG 24 hr capsule TK 1 C PO QD IN THE EVE    Allergies:   Patient has no known allergies.  Review of Systems (ROS): Review of Systems  Constitutional:  Negative for chills and fever.  Respiratory: Negative for cough and shortness of breath.   Cardiovascular: Negative for chest pain and palpitations.  Musculoskeletal:       Pain and intermittent swelling to LEFT middle finger.  Neurological: Negative for numbness.  All other systems reviewed and are negative.    Vital Signs: Today's Vitals   04/22/19 0956 04/22/19 1000 04/22/19 1115  BP:  117/90   Pulse:  76   Resp:  16   Temp:  98.5 F (36.9 C)   TempSrc:  Oral   SpO2:  100%   Weight: 140 lb (63.5 kg)    Height: 5\' 2"  (1.575 m)    PainSc: 8   8     Physical Exam: Physical Exam  Constitutional: She is oriented to person, place, and time and well-developed, well-nourished, and in no distress. No distress.  HENT:  Head: Normocephalic and atraumatic.  Eyes: Pupils are equal, round, and reactive to light.  Cardiovascular: Normal rate and intact distal pulses.  Pulmonary/Chest: Effort normal. No respiratory distress.  Abdominal: There is no abdominal tenderness.  Musculoskeletal: Normal range of motion.     Left hand: She exhibits tenderness. She exhibits normal range of motion, normal two-point discrimination, normal capillary refill, no deformity and no swelling. Normal sensation noted. Normal strength noted.       Hands:  Neurological: She is alert and oriented to person, place, and time. Gait normal.  Skin: Skin is warm and dry.  No rash noted. She is not diaphoretic.  Psychiatric: Mood, memory, affect and judgment normal.  Nursing note and vitals reviewed.   Urgent Care Treatments / Results:   LABS: PLEASE NOTE: all labs that were ordered this encounter are listed, however only abnormal results are displayed. Labs Reviewed - No data to display  EKG: -None  RADIOLOGY: Dg Finger Middle Left  Result Date: 04/22/2019 CLINICAL DATA:  One month history of pain involving the left middle finger. EXAM: LEFT MIDDLE FINGER 2+V COMPARISON:  None. FINDINGS: The joint spaces  are maintained. No arthropathic changes. No acute bony abnormality. IMPRESSION: No significant bony findings. Electronically Signed   By: Rudie Meyer M.D.   On: 04/22/2019 11:00    PROCEDURES: Procedures  MEDICATIONS RECEIVED THIS VISIT: Medications - No data to display  PERTINENT CLINICAL COURSE NOTES/UPDATES:   Initial Impression / Assessment and Plan / Urgent Care Course:  Pertinent labs & imaging results that were available during my care of the patient were personally reviewed by me and considered in my medical decision making (see lab/imaging section of note for values and interpretations).  Lesle Siebels is a 23 y.o. female who presents to [facility] today with complaints of finger pain (left middle finger)   Patient is well appearing overall in clinic today. She does not appear to be in any acute distress. Presenting symptoms (see HPI) and exam as documented above.  Diagnostic radiographs of the LEFT middle finger reveals no arthropathic changes or acute osseous abnormalities.  Etiology of pain unknown at this point given the chronicity of her symptoms.  Patient has been treating symptoms with APAP alone.  Will add NSAID (ketorolac) for the next week.  Patient encouraged to apply ice 3 times daily for at least 10 to 15 minutes at a time.  If pain not improving she was encouraged to follow-up with orthopedics for further evaluation. Name and office contact information provided on today's AVS for Dr. Deeann Saint ([facility]). Patient advised the she will need to contact the office to schedule an appointment to be seen.   Discussed follow up with primary care physician in 1 week for re-evaluation. I have reviewed the follow up and strict return precautions for any new or worsening symptoms. Patient is aware of symptoms that would be deemed urgent/emergent, and would thus require further evaluation either here or in the emergency department. At the time of discharge, she  verbalized understanding and consent with the discharge plan as it was reviewed with her. All questions were fielded by provider and/or clinic staff prior to patient discharge.    Final Clinical Impressions / Urgent Care Diagnoses:   Final diagnoses:  Pain of left middle finger    New Prescriptions:  [place] Controlled Substance Registry consulted? Not Applicable  Meds ordered this encounter  Medications  . ketorolac (TORADOL) 10 MG tablet    Sig: Take 1 tablet (10 mg total) by mouth every 8 (eight) hours as needed.    Dispense:  15 tablet    Refill:  0    Recommended Follow up Care:  Patient encouraged to follow up with the following provider within the specified time frame, or sooner as dictated by the severity of her symptoms. As always, she was instructed that for any urgent/emergent care needs, she should seek care either here or in the emergency department for more immediate evaluation.  Follow-up Information    Deeann Saint, MD In 1 week.   Specialty: Orthopedic Surgery Why: General reassessment  of symptoms if not improving Contact information: [place] [id number] [phone]         NOTE: This note was prepared using Dragon dictation software along with smaller phrase technology. Despite my best ability to proofread, there is the potential that transcriptional errors may still occur from this process, and are completely unintentional.    Karen Kitchens, NP 04/23/19 [phone]

## 2019-08-12 ENCOUNTER — Encounter: Payer: Self-pay | Admitting: Emergency Medicine

## 2019-08-12 ENCOUNTER — Ambulatory Visit
Admission: EM | Admit: 2019-08-12 | Discharge: 2019-08-12 | Disposition: A | Discharge status: Home / Self Care | Payer: BC Managed Care – PPO | Attending: Family Medicine | Admitting: Family Medicine

## 2019-08-12 ENCOUNTER — Other Ambulatory Visit: Payer: Self-pay

## 2019-08-12 DIAGNOSIS — R1013 Epigastric pain: Secondary | ICD-10-CM | POA: Diagnosis present

## 2019-08-12 DIAGNOSIS — Z3202 Encounter for pregnancy test, result negative: Secondary | ICD-10-CM | POA: Diagnosis not present

## 2019-08-12 DIAGNOSIS — R1031 Right lower quadrant pain: Secondary | ICD-10-CM | POA: Diagnosis present

## 2019-08-12 DIAGNOSIS — R112 Nausea with vomiting, unspecified: Secondary | ICD-10-CM | POA: Insufficient documentation

## 2019-08-12 LAB — URINALYSIS, COMPLETE (UACMP) WITH MICROSCOPIC
Bacteria, UA: NONE SEEN
Bilirubin Urine: NEGATIVE
Glucose, UA: NEGATIVE mg/dL
Ketones, ur: NEGATIVE mg/dL
Leukocytes,Ua: NEGATIVE
Nitrite: NEGATIVE
Protein, ur: NEGATIVE mg/dL
Specific Gravity, Urine: 1.02 (ref 1.005–1.030)
pH: 7.5 (ref 5.0–8.0)

## 2019-08-12 LAB — PREGNANCY, URINE: Preg Test, Ur: NEGATIVE

## 2019-08-12 NOTE — ED Triage Notes (Signed)
Patient c/o nausea, stomach cramps and back pain that started about a week ago.  Patient reports spotting blood 2 days ago.  Patient states that her last menstrual cycle was on 07/20/19.  Patient states that her period lasted longer for about 1-2 weeks.  Patient states that she removed her Nuvaring on 07/15/19 but has not put a new one at this time.

## 2019-08-12 NOTE — Discharge Instructions (Addendum)
Recommend patient go to Emergency Department for further evaluation and management °

## 2019-08-14 NOTE — ED Provider Notes (Signed)
[facility]    CSN: [id number] Arrival date & time: 08/12/19  1133      History   Chief Complaint Chief Complaint  Patient presents with  . Nausea  . Back Pain    HPI [place] is a 24 y.o. female.   24 yo female with a c/o nausea and right lower abdominal pain for the past week. Denies any fevers, chills, vomiting, diarrhea, constipation.    Back Pain   History reviewed. No pertinent past medical history.  There are no problems to display for this patient.   History reviewed. No pertinent surgical history.  OB History   No obstetric history on file.      Home Medications    Prior to Admission medications   Medication Sig Start Date End Date Taking? Authorizing Provider  etonogestrel-ethinyl estradiol (NUVARING) 0.12-0.015 MG/24HR vaginal ring Place 1 each vaginally every 28 (twenty-eight) days. Insert vaginally and leave in place for 3 consecutive weeks, then remove for 1 week.   Yes [provider]  ketorolac (TORADOL) 10 MG tablet Take 1 tablet (10 mg total) by mouth every 8 (eight) hours as needed. 04/22/19   Karen Kitchens, NP  propranolol ER (INDERAL LA) 60 MG 24 hr capsule TK 1 C PO QD IN THE EVE 12/17/18   [provider]    Family History Family History  Problem Relation Age of Onset  . Healthy Mother   . Healthy Father     Social History Social History   Tobacco Use  . Smoking status: Former Research scientist (life sciences)  . Smokeless tobacco: Never Used  Substance Use Topics  . Alcohol use: Yes  . Drug use: No     Allergies   Patient has no known allergies.   Review of Systems Review of Systems  Musculoskeletal: Positive for back pain.     Physical Exam Triage Vital Signs ED Triage Vitals  Enc Vitals Group     BP 08/12/19 1149 (!) 148/105     Pulse Rate 08/12/19 1149 84     Resp 08/12/19 1149 14     Temp 08/12/19 1149 98.8 F (37.1 C)     Temp Source 08/12/19 1149 Oral     SpO2 08/12/19 1149 99 %     Weight  08/12/19 1147 140 lb (63.5 kg)     Height 08/12/19 1147 5\' 2"  (1.575 m)     Head Circumference --      Peak Flow --      Pain Score 08/12/19 1146 8     Pain Loc --      Pain Edu? --      Excl. in [place]? --    No data found.  Updated Vital Signs BP (!) 148/105 (BP Location: Right Arm)   Pulse 84   Temp 98.8 F (37.1 C) (Oral)   Resp 14   Ht 5\' 2"  (1.575 m)   Wt 63.5 kg   LMP 07/20/2019 (Exact Date)   SpO2 99%   BMI 25.61 kg/m   Visual Acuity Right Eye Distance:   Left Eye Distance:   Bilateral Distance:    Right Eye Near:   Left Eye Near:    Bilateral Near:     Physical Exam Vitals and nursing note reviewed.  Constitutional:      General: She is not in acute distress.    Appearance: She is not toxic-appearing or diaphoretic.  Cardiovascular:     Rate and Rhythm: Normal rate.  Heart sounds: Normal heart sounds.  Pulmonary:     Effort: Pulmonary effort is normal. No respiratory distress.     Breath sounds: Normal breath sounds.  Abdominal:     General: Bowel sounds are normal. There is no distension.     Palpations: Abdomen is soft.     Tenderness: There is abdominal tenderness (at McBurney's point). There is no right CVA tenderness, guarding or rebound.  Neurological:     Mental Status: She is alert.      UC Treatments / Results  Labs (all labs ordered are listed, but only abnormal results are displayed) Labs Reviewed  URINALYSIS, COMPLETE (UACMP) WITH MICROSCOPIC - Abnormal; Notable for the following components:      Result Value   Hgb urine dipstick MODERATE (*)    All other components within normal limits  PREGNANCY, URINE    EKG   Radiology No results found.  Procedures Procedures (including critical care time)  Medications Ordered in UC Medications - No data to display  Initial Impression / Assessment and Plan / UC Course  I have reviewed the triage vital signs and the nursing notes.  Pertinent labs & imaging results that were  available during my care of the patient were reviewed by me and considered in my medical decision making (see chart for details).     Final Clinical Impressions(s) / UC Diagnoses   Final diagnoses:  Acute right lower quadrant pain  Tenderness at McBurney's point  Non-intractable vomiting with nausea, unspecified vomiting type     Discharge Instructions     Recommend patient go to Emergency Department for further evaluation and management    ED Prescriptions    None      1.diagnosis reviewed with patient; recommend patient go to Emergency Department for further evaluation and management.   PDMP not reviewed this encounter.   Payton Mccallum, MD 08/14/19 1352

## 2019-09-27 ENCOUNTER — Ambulatory Visit
Admission: EM | Admit: 2019-09-27 | Discharge: 2019-09-27 | Disposition: A | Discharge status: Home / Self Care | Payer: BC Managed Care – PPO | Attending: Urgent Care | Admitting: Urgent Care

## 2019-09-27 ENCOUNTER — Other Ambulatory Visit: Payer: Self-pay

## 2019-09-27 ENCOUNTER — Encounter: Payer: Self-pay | Admitting: Emergency Medicine

## 2019-09-27 DIAGNOSIS — N760 Acute vaginitis: Secondary | ICD-10-CM | POA: Diagnosis not present

## 2019-09-27 DIAGNOSIS — B3731 Acute candidiasis of vulva and vagina: Secondary | ICD-10-CM

## 2019-09-27 DIAGNOSIS — Z113 Encounter for screening for infections with a predominantly sexual mode of transmission: Secondary | ICD-10-CM | POA: Insufficient documentation

## 2019-09-27 DIAGNOSIS — B373 Candidiasis of vulva and vagina: Secondary | ICD-10-CM | POA: Insufficient documentation

## 2019-09-27 DIAGNOSIS — B9689 Other specified bacterial agents as the cause of diseases classified elsewhere: Secondary | ICD-10-CM | POA: Diagnosis present

## 2019-09-27 HISTORY — DX: Acute vaginitis: N76.0

## 2019-09-27 HISTORY — DX: Other specified bacterial agents as the cause of diseases classified elsewhere: B96.89

## 2019-09-27 LAB — WET PREP, GENITAL: Trich, Wet Prep: NONE SEEN

## 2019-09-27 LAB — CHLAMYDIA/NGC RT PCR (ARMC ONLY)
Chlamydia Tr: NOT DETECTED
N gonorrhoeae: NOT DETECTED

## 2019-09-27 MED ORDER — FLUCONAZOLE 150 MG PO TABS: ORAL_TABLET | ORAL | 0 refills | Status: DC

## 2019-09-27 MED ORDER — METRONIDAZOLE 500 MG PO TABS: 500.0000 mg | ORAL_TABLET | Freq: Two times a day (BID) | ORAL | 0 refills | Status: DC

## 2019-09-27 MED ORDER — METRONIDAZOLE 0.75 % VA GEL: 1.0000 | Freq: Two times a day (BID) | VAGINAL | 0 refills | Status: DC

## 2019-09-27 NOTE — ED Provider Notes (Signed)
[place], [place]   Name: Candace Curtis DOB: 11-30-1995 MRN: [id number] CSN: [id number] PCP: System, Pcp Not In  Arrival date and time:  09/27/19 1133  Chief Complaint:  Vaginal Itching  NOTE: Prior to seeing the patient today, I have reviewed the triage nursing documentation and vital signs. Clinical staff has updated patient's PMH/PSHx, current medication list, and drug allergies/intolerances to ensure comprehensive history available to assist in medical decision making.   History:   HPI: Candace Curtis is a 24 y.o. female who presents today with complaints of vaginal itching that began approximately 2 days ago. She denies vaginal discharge, however complains of a foul odor coming from her vagina. She denies any associated vaginal/pelvic pain. She has not appreciated any bleeding. Patient has not experienced any concurrent urinary symptoms; no dysuria, frequency, urgency, or gross hematuria. Patient denies any associated nausea, vomiting, fever/chills, or pain in her lower back, flank area, or abdomen. Patient advises that she does not have a significant history for STIs in the past, however she is requesting to be tested for gonorrhea and chlamydia today. Patient endorses that she engages in unprotected sexual activity.  Patient's last menstrual period was 08/31/2019 (approximate). There are no concerns that she is currently pregnant.   Past Medical History:  Diagnosis Date  . BV (bacterial vaginosis)     Past Surgical History:  Procedure Laterality Date  . NO PAST SURGERIES      Family History  Problem Relation Age of Onset  . Healthy Mother   . Healthy Father     Social History   Tobacco Use  . Smoking status: Current Some Day Smoker  . Smokeless tobacco: Never Used  Substance Use Topics  . Alcohol use: Yes  . Drug use: No    There are no problems to display for this patient.   Home Medications:    Current Meds  Medication Sig  . etonogestrel-ethinyl estradiol  (NUVARING) 0.12-0.015 MG/24HR vaginal ring Place 1 each vaginally every 28 (twenty-eight) days. Insert vaginally and leave in place for 3 consecutive weeks, then remove for 1 week.    Allergies:   Patient has no known allergies.  Review of Systems (ROS):  Review of systems NEGATIVE unless otherwise noted in narrative H&P section.   Vital Signs: Today's Vitals   09/27/19 1155 09/27/19 1159  BP:  (!) 127/91  Pulse:  92  Resp:  18  Temp:  98.4 F (36.9 C)  TempSrc:  Oral  SpO2:  100%  Weight: 140 lb (63.5 kg)   Height: 5\' 2"  (1.575 m)   PainSc: 0-No pain     Physical Exam: Physical Exam  Constitutional: She is oriented to person, place, and time and well-developed, well-nourished, and in no distress.  HENT:  Head: Normocephalic and atraumatic.  Eyes: Pupils are equal, round, and reactive to light.  Cardiovascular: Normal rate, regular rhythm, normal heart sounds and intact distal pulses.  Pulmonary/Chest: Effort normal and breath sounds normal.  Abdominal: Soft. Normal appearance and bowel sounds are normal. She exhibits no distension. There is no abdominal tenderness. There is no CVA tenderness.  Genitourinary:    Genitourinary Comments: Exam deferred. No vaginal/pelvic pain or bleeding. Patient is not currently pregnant. She has elected to self collect specimen swab for wet prep and DNA probe for GC.   Neurological: She is alert and oriented to person, place, and time. Gait normal.  Skin: Skin is warm and dry. No rash noted. She is not diaphoretic.  Psychiatric: Mood, memory, affect  and judgment normal.  Nursing note and vitals reviewed.   Urgent Care Treatments / Results:   Orders Placed This Encounter  Procedures  . Wet prep, genital  . Chlamydia/NGC rt PCR ([facility] only)    LABS: PLEASE NOTE: all labs that were ordered this encounter are listed, however only abnormal results are displayed. Labs Reviewed  WET PREP, GENITAL - Abnormal; Notable for the following  components:      Result Value   Yeast Wet Prep HPF POC PRESENT (*)    Clue Cells Wet Prep HPF POC PRESENT (*)    WBC, Wet Prep HPF POC FEW (*)    All other components within normal limits  CHLAMYDIA/NGC RT PCR ([facility] ONLY)    EKG: -None  RADIOLOGY: No results found.  PROCEDURES: Procedures  MEDICATIONS RECEIVED THIS VISIT: Medications - No data to display  PERTINENT CLINICAL COURSE NOTES/UPDATES:   Initial Impression / Assessment and Plan / Urgent Care Course:  Pertinent labs & imaging results that were available during my care of the patient were personally reviewed by me and considered in my medical decision making (see lab/imaging section of note for values and interpretations).  Candace Curtis is a 24 y.o. female who presents to [facility] today with complaints of Vaginal Itching  Patient is well appearing overall in clinic today. She does not appear to be in any acute distress. Presenting symptoms (see HPI) and exam as documented above.    Wet prep swab did not reveal any trichomonas, however the sample was (+) for clue cells and candida, which is consistent with bacterial vaginosis (BV) and vulvovaginal candidiasis infections.  o Will treat with a 5 day course of intravaginal Metrogel 0.75% and oral fluconazole (150 mg x 1 dose, then may repeat 150 mg dose in 3 days if still symptomatic).   o Patient encouraged to complete the entire course of antibiotics even if she begins to feel better.   o She was educated on need to avoid all ETOH while she is taking this medication in order to prevent a reaction that could result in significant nausea and vomiting. While not as pronounced, Metrogel can produce a disulfiram-like reaction similar to that seen with oral metronidazole.  o Patient encouraged to increase her fluid intake as much as possible. Discussed that water is always best to flush the urinary tract and prevent development of a urinary tract infection  while on treatment for the BV.  Discussed follow up with primary care physician in 1 week for re-evaluation. I have reviewed the follow up and strict return precautions for any new or worsening symptoms. Patient is aware of symptoms that would be deemed urgent/emergent, and would thus require further evaluation either here or in the emergency department. At the time of discharge, she verbalized understanding and consent with the discharge plan as it was reviewed with her. All questions were fielded by provider and/or clinic staff prior to patient discharge.    ADDENDUM: Patient returned call to the clinic advising that she could not afford the Metrogel. She is asking for the Rx to be changed to the oral metronidazole. Rx sent per request for a 7 day course of oral therapy.   Final Clinical Impressions / Urgent Care Diagnoses:   Final diagnoses:  BV (bacterial vaginosis)  Vulvovaginal candidiasis  Screening examination for STD (sexually transmitted disease)    New Prescriptions:  [place] Controlled Substance Registry consulted? Not Applicable  Meds ordered this encounter  Medications  . DISCONTD: metroNIDAZOLE (  METROGEL VAGINAL) 0.75 % vaginal gel    Sig: Place 1 Applicatorful vaginally 2 (two) times daily. X 5 days    Dispense:  70 g    Refill:  0  . fluconazole (DIFLUCAN) 150 MG tablet    Sig: Take 1 tablet (150 mg) PO x 1 dose. May repeat 150 mg dose in 3 days if still symptomatic.    Dispense:  2 tablet    Refill:  0  . metroNIDAZOLE (FLAGYL) 500 MG tablet    Sig: Take 1 tablet (500 mg total) by mouth 2 (two) times daily.    Dispense:  14 tablet    Refill:  0    Recommended Follow up Care:  Patient encouraged to follow up with the following provider within the specified time frame, or sooner as dictated by the severity of her symptoms. As always, she was instructed that for any urgent/emergent care needs, she should seek care either here or in the emergency department for more  immediate evaluation.  Follow-up Information    PCP In 1 week.   Why: General reassessment of symptoms if not improving        NOTE: This note was prepared using Scientist, clinical (histocompatibility and immunogenetics) along with smaller Lobbyist. Despite my best ability to proofread, there is the potential that transcriptional errors may still occur from this process, and are completely unintentional.    Verlee Monte, NP 09/27/19 2249

## 2019-09-27 NOTE — Discharge Instructions (Addendum)
It was very nice seeing you today in clinic. Thank you for entrusting me with your care.   As discussed, your swab is POSITIVE for infection (BV and yeast). Will approach treatment as follows:  Prescription has been sent to your pharmacy for antibiotics.  Please pick up and take as directed. FINISH the entire course of medication even if you are feeling better.  NO ALCOHOL while on the BV medication as it will make you very sick.  Increase fluid intake as much as possible to flush your urinary tract.  Water is always the best.  May use Tylenol and/or Ibuprofen as needed for pain/fever.  Make arrangements to follow up with your regular doctor in 1 week for re-evaluation. If your symptoms/condition worsens, please seek follow up care either here or in the ER. Please remember, our [facility] providers are "right here with you" when you need [place].   Again, it was my pleasure to take care of you today. Thank you for choosing our clinic. I hope that you start to feel better quickly.   Quentin Mulling, MSN, APRN, FNP-C, CEN Advanced Practice Provider [facility]

## 2019-09-27 NOTE — ED Triage Notes (Signed)
Pt c/o vaginal odor, mild itching. Started about 2 days ago. Denies vaginal discharge. Would like to test for STDs also.

## 2020-10-13 ENCOUNTER — Ambulatory Visit
Admission: EM | Admit: 2020-10-13 | Discharge: 2020-10-13 | Disposition: A | Discharge status: Home / Self Care | Payer: 59 | Attending: Sports Medicine | Admitting: Sports Medicine

## 2020-10-13 ENCOUNTER — Other Ambulatory Visit: Payer: Self-pay

## 2020-10-13 ENCOUNTER — Encounter: Payer: Self-pay | Admitting: Emergency Medicine

## 2020-10-13 DIAGNOSIS — R222 Localized swelling, mass and lump, trunk: Secondary | ICD-10-CM | POA: Diagnosis not present

## 2020-10-13 DIAGNOSIS — R030 Elevated blood-pressure reading, without diagnosis of hypertension: Secondary | ICD-10-CM | POA: Diagnosis not present

## 2020-10-13 LAB — WET PREP, GENITAL
Clue Cells Wet Prep HPF POC: NONE SEEN
Sperm: NONE SEEN
Trich, Wet Prep: NONE SEEN
WBC, Wet Prep HPF POC: NONE SEEN
Yeast Wet Prep HPF POC: NONE SEEN

## 2020-10-13 LAB — URINALYSIS, COMPLETE (UACMP) WITH MICROSCOPIC
Bilirubin Urine: NEGATIVE
Glucose, UA: NEGATIVE mg/dL
Ketones, ur: 40 mg/dL — AB
Leukocytes,Ua: NEGATIVE
Nitrite: NEGATIVE
Protein, ur: NEGATIVE mg/dL
Specific Gravity, Urine: 1.02 (ref 1.005–1.030)
pH: 6 (ref 5.0–8.0)

## 2020-10-13 MED ORDER — DOXYCYCLINE HYCLATE 100 MG PO CAPS: 100.0000 mg | ORAL_CAPSULE | Freq: Two times a day (BID) | ORAL | 0 refills | Status: AC

## 2020-10-13 NOTE — ED Provider Notes (Signed)
[facility]    CSN: [id number] Arrival date & time: 10/13/20  1152      History   Chief Complaint Chief Complaint  Patient presents with  . Abdominal Pain    HPI Candace Curtis is a 25 y.o. female presenting for a small swollen lump of the left lower abdomen x1 week.  Patient says the pain is localized to the small knot that she feels.  She denies any associated symptoms.  She has not had any fever, fatigue, body aches, nausea/vomiting/diarrhea, dysuria, urinary frequency urgency, vaginal discharge itching or odor.  No concern for STIs.  Last menstrual period was 10/06/2020.  No concern for pregnancy.  Patient denies any similar problem in the past.  Has taken no over-the-counter medications for symptoms.  She has no other concerns.  HPI  Past Medical History:  Diagnosis Date  . BV (bacterial vaginosis)     There are no problems to display for this patient.   Past Surgical History:  Procedure Laterality Date  . NO PAST SURGERIES      OB History   No obstetric history on file.      Home Medications    Prior to Admission medications   Medication Sig Start Date End Date Taking? Authorizing Provider  doxycycline (VIBRAMYCIN) 100 MG capsule Take 1 capsule (100 mg total) by mouth 2 (two) times daily for 7 days. 10/13/20 10/20/20 Yes Eusebio FriendlyEaves, Everest Hacking B, PA-C  ESTRADIOL-NORGESTIMATE PO norgestimate 0.25 mg-ethinyl estradiol 35 mcg tablet  TAKE 1 TABLET BY MOUTH EVERY DAY   Yes [provider]  etonogestrel-ethinyl estradiol (NUVARING) 0.12-0.015 MG/24HR vaginal ring Place 1 each vaginally every 28 (twenty-eight) days. Insert vaginally and leave in place for 3 consecutive weeks, then remove for 1 week.    [provider]  fluconazole (DIFLUCAN) 150 MG tablet Take 1 tablet (150 mg) PO x 1 dose. May repeat 150 mg dose in 3 days if still symptomatic. 09/27/19   Verlee MonteGray, Bryan E, NP  metroNIDAZOLE (FLAGYL) 500 MG tablet Take 1 tablet (500 mg total) by mouth 2 (two)  times daily. 09/27/19   Verlee MonteGray, Bryan E, NP  propranolol ER (INDERAL LA) 60 MG 24 hr capsule TK 1 C PO QD IN THE EVE 12/17/18 09/27/19  [provider]    Family History Family History  Problem Relation Age of Onset  . Hypertension Mother   . Hypertension Father     Social History Social History   Tobacco Use  . Smoking status: Former Smoker    Quit date: 10/13/2013    Years since quitting: 7.0  . Smokeless tobacco: Never Used  Vaping Use  . Vaping Use: Every day  . Substances: Nicotine, Flavoring  Substance Use Topics  . Alcohol use: Yes    Comment: occasional  . Drug use: No     Allergies   Patient has no known allergies.   Review of Systems Review of Systems  Constitutional: Negative for chills, fatigue and fever.  Respiratory: Negative for shortness of breath.   Cardiovascular: Negative for chest pain and palpitations.  Gastrointestinal: Positive for abdominal pain. Negative for diarrhea, nausea and vomiting.  Genitourinary: Negative for decreased urine volume, dysuria, flank pain, frequency, hematuria, pelvic pain, urgency, vaginal bleeding, vaginal discharge and vaginal pain.  Musculoskeletal: Negative for back pain.  Skin: Negative for rash.  Neurological: Negative for dizziness and headaches.  Hematological: Negative for adenopathy.     Physical Exam Triage Vital Signs ED Triage Vitals  Enc Vitals Group  BP 10/13/20 1219 (!) 144/107     Pulse Rate 10/13/20 1219 91     Resp 10/13/20 1219 18     Temp 10/13/20 1219 98.6 F (37 C)     Temp Source 10/13/20 1219 Oral     SpO2 10/13/20 1219 100 %     Weight 10/13/20 1220 126 lb 1.6 oz (57.2 kg)     Height 10/13/20 1220 5\' 2"  (1.575 m)     Head Circumference --      Peak Flow --      Pain Score 10/13/20 1220 8     Pain Loc --      Pain Edu? --      Excl. in GC? --    No data found.  Updated Vital Signs BP (!) 147/106 (BP Location: Left Arm)   Pulse 91   Temp 98.6 F (37 C) (Oral)   Resp 18    Ht 5\' 2"  (1.575 m)   Wt 126 lb 1.6 oz (57.2 kg)   LMP 10/06/2020 (Approximate) Comment: irregular with Nuvaring, now on OCP  SpO2 100%   BMI 23.06 kg/m       Physical Exam Vitals and nursing note reviewed.  Constitutional:      General: She is not in acute distress.    Appearance: Normal appearance. She is not ill-appearing or toxic-appearing.  HENT:     Head: Normocephalic and atraumatic.     Nose: Nose normal.     Mouth/Throat:     Mouth: Mucous membranes are moist.     Pharynx: Oropharynx is clear.  Eyes:     General: No scleral icterus.       Right eye: No discharge.        Left eye: No discharge.     Conjunctiva/sclera: Conjunctivae normal.  Cardiovascular:     Rate and Rhythm: Normal rate and regular rhythm.     Heart sounds: Normal heart sounds.  Pulmonary:     Effort: Pulmonary effort is normal. No respiratory distress.     Breath sounds: Normal breath sounds.  Abdominal:     General: Bowel sounds are normal.     Tenderness: There is abdominal tenderness (mild TTP localized to lesion. There is an indurated non mobile mass of the left lower abdomen. No overlying erythema. ~2 cm x 1 cm).  Musculoskeletal:     Cervical back: Neck supple.  Skin:    General: Skin is dry.  Neurological:     General: No focal deficit present.     Mental Status: She is alert. Mental status is at baseline.     Motor: No weakness.     Gait: Gait normal.  Psychiatric:        Mood and Affect: Mood normal.        Behavior: Behavior normal.        Thought Content: Thought content normal.      UC Treatments / Results  Labs (all labs ordered are listed, but only abnormal results are displayed) Labs Reviewed  URINALYSIS, COMPLETE (UACMP) WITH MICROSCOPIC - Abnormal; Notable for the following components:      Result Value   Hgb urine dipstick SMALL (*)    Ketones, ur 40 (*)    Bacteria, UA FEW (*)    All other components within normal limits  WET PREP, GENITAL     EKG   Radiology No results found.  Procedures Procedures (including critical care time)  Medications Ordered in UC Medications - No data  to display  Initial Impression / Assessment and Plan / UC Course  I have reviewed the triage vital signs and the nursing notes.  Pertinent labs & imaging results that were available during my care of the patient were reviewed by me and considered in my medical decision making (see chart for details).   25 year old female presenting for tender lump of the left lower abdomen x1 week.  Exam suspicious for epidermal cyst versus abscess.  Wet prep was performed which was normal.  Urinalysis perform a small blood and ketones.  No real concern for vaginitis or UTI.  Advised patient of supportive care at this time.  Advised to apply heat to the area.  Advised that if not getting better over the next couple of days where she notices it is getting larger she can try doxycycline that I prescribed for possible abscess.  There is no fluctuance at this area so I do not want to try to attempt an I&D.  I did advise that she has been appoint with her PCP if not improving as she may need an ultrasound of this area to determine what exactly it is or possibly referral to dermatology.  Patient is agreeable.  Blood pressure readings have been elevated in the clinic with the last reading of 147/106.  Patient says she has been told that her blood pressure has been high in the past but she does not take any medication.  Does admit to a strong family history of hypertension.  I advised her to keep a log of this at home and to follow-up with her PCP as she may need a medication or further work-up.  Patient agreeable.   Final Clinical Impressions(s) / UC Diagnoses   Final diagnoses:  Abdominal wall lump  Elevated blood pressure reading in office without diagnosis of hypertension     Discharge Instructions     The urine and vaginal swab are normal.  The lump that you  have is most consistent with an underlying cyst or small abscess.  Have sent antibiotics to treat possible abscess.  Apply warm compresses to this area.  You can take Tylenol or Motrin for pain.  If this is persisting over the next week you should follow-up with your PCP as you may need ultrasound of this area.  For any worsening symptoms, please return or go to ED.  ABDOMINAL PAIN: You may take Tylenol for pain relief. Use medications as directed including antiemetics and antidiarrheal medications if suggested or prescribed. You should increase fluids and electrolytes as well as rest over these next several days. If you have any questions or concerns, or if your symptoms are not improving or if especially if they acutely worsen, please call or stop back to the clinic immediately and we will be happy to help you or go to the ER   ABDOMINAL PAIN RED FLAGS: Seek immediate further care if: symptoms remain the same or worsen over the next 3-7 days, you are unable to keep fluids down, you see blood or mucus in your stool, you vomit black or dark red material, you have a fever of 101.F or higher, you have localized and/or persistent abdominal pain      ED Prescriptions    Medication Sig Dispense Auth. Provider   doxycycline (VIBRAMYCIN) 100 MG capsule Take 1 capsule (100 mg total) by mouth 2 (two) times daily for 7 days. 14 capsule Shirlee Latch, PA-C     PDMP not reviewed this encounter.   Michiel Cowboy,  Algis Greenhouse, PA-C 10/13/20 1616

## 2020-10-13 NOTE — ED Triage Notes (Addendum)
Patient in today c/o left sided abdominal pain x 1 week. Patient states she felt a "knot" in her abdomen yesterday. Patient states she is having urinary frequency, but she does drink a lot of water. Patient has not taken any OTC medications for her symptoms. Patient denies any vaginal discharge. Patient states the pain is worse with movement.

## 2020-10-13 NOTE — Discharge Instructions (Signed)
The urine and vaginal swab are normal.  The lump that you have is most consistent with an underlying cyst or small abscess.  Have sent antibiotics to treat possible abscess.  Apply warm compresses to this area.  You can take Tylenol or Motrin for pain.  If this is persisting over the next week you should follow-up with your PCP as you may need ultrasound of this area.  For any worsening symptoms, please return or go to ED.  ABDOMINAL PAIN: You may take Tylenol for pain relief. Use medications as directed including antiemetics and antidiarrheal medications if suggested or prescribed. You should increase fluids and electrolytes as well as rest over these next several days. If you have any questions or concerns, or if your symptoms are not improving or if especially if they acutely worsen, please call or stop back to the clinic immediately and we will be happy to help you or go to the ER   ABDOMINAL PAIN RED FLAGS: Seek immediate further care if: symptoms remain the same or worsen over the next 3-7 days, you are unable to keep fluids down, you see blood or mucus in your stool, you vomit black or dark red material, you have a fever of 101.F or higher, you have localized and/or persistent abdominal pain

## 2021-01-01 ENCOUNTER — Other Ambulatory Visit: Payer: Self-pay

## 2021-01-01 ENCOUNTER — Ambulatory Visit
Admission: EM | Admit: 2021-01-01 | Discharge: 2021-01-01 | Disposition: A | Discharge status: Home / Self Care | Payer: 59 | Attending: Family Medicine | Admitting: Family Medicine

## 2021-01-01 DIAGNOSIS — J029 Acute pharyngitis, unspecified: Secondary | ICD-10-CM | POA: Diagnosis not present

## 2021-01-01 DIAGNOSIS — B9789 Other viral agents as the cause of diseases classified elsewhere: Secondary | ICD-10-CM | POA: Diagnosis not present

## 2021-01-01 DIAGNOSIS — J028 Acute pharyngitis due to other specified organisms: Secondary | ICD-10-CM | POA: Insufficient documentation

## 2021-01-01 DIAGNOSIS — H9209 Otalgia, unspecified ear: Secondary | ICD-10-CM | POA: Insufficient documentation

## 2021-01-01 DIAGNOSIS — Z20822 Contact with and (suspected) exposure to covid-19: Secondary | ICD-10-CM | POA: Insufficient documentation

## 2021-01-01 LAB — SARS CORONAVIRUS 2 (TAT 6-24 HRS): SARS Coronavirus 2: NEGATIVE

## 2021-01-01 MED ORDER — KETOROLAC TROMETHAMINE 10 MG PO TABS: 10.0000 mg | ORAL_TABLET | Freq: Four times a day (QID) | ORAL | 0 refills | Status: DC | PRN

## 2021-01-01 MED ORDER — CETIRIZINE-PSEUDOEPHEDRINE ER 5-120 MG PO TB12: 1.0000 | ORAL_TABLET | Freq: Two times a day (BID) | ORAL | 0 refills | Status: DC

## 2021-01-01 NOTE — ED Provider Notes (Signed)
[facility]    CSN: [id number] Arrival date & time: 01/01/21  0831      History   Chief Complaint Chief Complaint  Patient presents with   Otalgia   Sore Throat    HPI 25 year old female presents with the above complaints.  Patient states that approximate 2 weeks ago she had symptoms but these resolved.  She states that her symptoms recurred again over the weekend.  She reports sore throat and ear pain.  She states that the ear pain varies from side to side.  She states that is worse at night.  Keeps her from sleeping.  Pain 8/10 in severity.  She has been taking Mucinex and Claritin without relief.  No fever.  No relieving factors.  No other complaints.   Past Medical History:  Diagnosis Date   BV (bacterial vaginosis)    Past Surgical History:  Procedure Laterality Date   NO PAST SURGERIES      OB History   No obstetric history on file.      Home Medications    Prior to Admission medications   Medication Sig Start Date End Date Taking? Authorizing Provider  cetirizine-pseudoephedrine (ZYRTEC-D) 5-120 MG tablet Take 1 tablet by mouth 2 (two) times daily. 01/01/21  Yes Tameem Pullara G, DO  ketorolac (TORADOL) 10 MG tablet Take 1 tablet (10 mg total) by mouth every 6 (six) hours as needed for moderate pain or severe pain. 01/01/21  Yes Kysen Wetherington G, DO  ESTRADIOL-NORGESTIMATE PO norgestimate 0.25 mg-ethinyl estradiol 35 mcg tablet  TAKE 1 TABLET BY MOUTH EVERY DAY    [provider]  propranolol ER (INDERAL LA) 60 MG 24 hr capsule TK 1 C PO QD IN THE EVE 12/17/18 09/27/19  [provider]    Family History Family History  Problem Relation Age of Onset   Hypertension Mother    Hypertension Father     Social History Social History   Tobacco Use   Smoking status: Former    Pack years: 0.00    Types: Cigarettes    Quit date: 10/13/2013    Years since quitting: 7.2   Smokeless tobacco: Never  Vaping Use   Vaping Use: Every day    Substances: Nicotine, Flavoring  Substance Use Topics   Alcohol use: Yes    Comment: occasional   Drug use: No     Allergies   Patient has no known allergies.   Review of Systems Review of Systems  Constitutional:  Negative for fever.  HENT:  Positive for ear pain and sore throat.     Physical Exam Triage Vital Signs ED Triage Vitals  Enc Vitals Group     BP 01/01/21 0900 (!) 149/105     Pulse Rate 01/01/21 0900 79     Resp 01/01/21 0900 16     Temp 01/01/21 0900 98.6 F (37 C)     Temp Source 01/01/21 0900 Oral     SpO2 01/01/21 0900 98 %     Weight --      Height --      Head Circumference --      Peak Flow --      Pain Score 01/01/21 0855 8     Pain Loc --      Pain Edu? --      Excl. in GC? --    Updated Vital Signs BP (!) 149/105 (BP Location: Left Arm)   Pulse 79   Temp 98.6 F (37 C) (Oral)  Resp 16   LMP 12/18/2020   SpO2 98%   Visual Acuity Right Eye Distance:   Left Eye Distance:   Bilateral Distance:    Right Eye Near:   Left Eye Near:    Bilateral Near:     Physical Exam Constitutional:      General: She is not in acute distress.    Appearance: Normal appearance. She is not ill-appearing.  HENT:     Head: Normocephalic and atraumatic.     Right Ear: Tympanic membrane normal.     Left Ear: Tympanic membrane normal.     Mouth/Throat:     Pharynx: Oropharynx is clear. No oropharyngeal exudate or posterior oropharyngeal erythema.  Cardiovascular:     Rate and Rhythm: Normal rate and regular rhythm.  Pulmonary:     Effort: Pulmonary effort is normal.     Breath sounds: Normal breath sounds. No wheezing or rales.  Neurological:     Mental Status: She is alert.  Psychiatric:        Mood and Affect: Mood normal.        Behavior: Behavior normal.     UC Treatments / Results  Labs (all labs ordered are listed, but only abnormal results are displayed) Labs Reviewed  SARS CORONAVIRUS 2 (TAT 6-24 HRS)    EKG   Radiology No  results found.  Procedures Procedures (including critical care time)  Medications Ordered in UC Medications - No data to display  Initial Impression / Assessment and Plan / UC Course  I have reviewed the triage vital signs and the nursing notes.  Pertinent labs & imaging results that were available during my care of the patient were reviewed by me and considered in my medical decision making (see chart for details).    25 year old female presents with viral pharyngitis.  Exam is unremarkable.  No evidence of otitis media.  Her oropharynx is clear.  I suspect that this is viral.  Zyrtec-D as directed.  Toradol as needed for pain.  Final Clinical Impressions(s) / UC Diagnoses   Final diagnoses:  Viral pharyngitis  Otalgia, unspecified laterality     Discharge Instructions      This is viral.  Your exam is normal. Throat and ears were clear.  Rest. Fluids.  Medications as directed. Stop claritin.  Take care  Dr. Adriana Simas    ED Prescriptions     Medication Sig Dispense Auth. Provider   cetirizine-pseudoephedrine (ZYRTEC-D) 5-120 MG tablet Take 1 tablet by mouth 2 (two) times daily. 30 tablet Reinaldo Helt G, DO   ketorolac (TORADOL) 10 MG tablet Take 1 tablet (10 mg total) by mouth every 6 (six) hours as needed for moderate pain or severe pain. 20 tablet Tommie Sams, DO      PDMP not reviewed this encounter.   Tommie Sams, [place] 01/01/21 [id number]

## 2021-01-01 NOTE — ED Triage Notes (Signed)
Pt presents with sore throat and ear pain.  States it is both ears but never at same time - R ear will hurt and R side of throat will hurt then pain will move to other side.  First started approx 2 weeks ago, seemed to improve but now returned. Also reports nasal congestion.  No fever.  Has been using Mucinex and Claritin with some relief.

## 2021-01-01 NOTE — Discharge Instructions (Addendum)
This is viral.  Your exam is normal. Throat and ears were clear.  Rest. Fluids.  Medications as directed. Stop claritin.  Take care  Dr. Adriana Simas

## 2021-03-13 ENCOUNTER — Ambulatory Visit
Admission: EM | Admit: 2021-03-13 | Discharge: 2021-03-13 | Disposition: A | Discharge status: Home / Self Care | Payer: 59 | Attending: Sports Medicine | Admitting: Sports Medicine

## 2021-03-13 ENCOUNTER — Other Ambulatory Visit: Payer: Self-pay

## 2021-03-13 DIAGNOSIS — M545 Low back pain, unspecified: Secondary | ICD-10-CM

## 2021-03-13 DIAGNOSIS — R1030 Lower abdominal pain, unspecified: Secondary | ICD-10-CM | POA: Diagnosis present

## 2021-03-13 DIAGNOSIS — R350 Frequency of micturition: Secondary | ICD-10-CM | POA: Diagnosis not present

## 2021-03-13 DIAGNOSIS — R110 Nausea: Secondary | ICD-10-CM | POA: Insufficient documentation

## 2021-03-13 LAB — URINALYSIS, COMPLETE (UACMP) WITH MICROSCOPIC
Bilirubin Urine: NEGATIVE
Glucose, UA: NEGATIVE mg/dL
Ketones, ur: NEGATIVE mg/dL
Leukocytes,Ua: NEGATIVE
Nitrite: NEGATIVE
Protein, ur: NEGATIVE mg/dL
Specific Gravity, Urine: 1.01 (ref 1.005–1.030)
pH: 5.5 (ref 5.0–8.0)

## 2021-03-13 LAB — PREGNANCY, URINE: Preg Test, Ur: NEGATIVE

## 2021-03-13 NOTE — ED Provider Notes (Signed)
[facility]    CSN: [id number] Arrival date & time: 03/13/21  1144      History   Chief Complaint Chief Complaint  Patient presents with   Abdominal Pain   Headache   Back Pain    HPI Candace Curtis is a 25 y.o. female.   Patient is a 25 year old female who presents for multiple complaints that have been going on for several weeks.  Patient is somewhat somnolent with the limited history that I was able to obtain.  I was advised by the nursing staff about her triage complaints.  She has been complaining of several weeks of low back pain, stomach cramps, headaches.  She reports that she was crying this morning and unable to get out of bed due to the pain.  When I entered the room today, she was texting on her phone and I asked her to put her phone away at which time she said that I needed to check my attitude and got belligerent and started to swear at me.  I asked her to calm down and keep the vulgarity to her self.  At that time she said I wanted to see another provider.  She then open the door and said she was leaving and wanted to talk to her manager.  She did not want to be seen by any provider in the office today and proceeded to leave the office without being evaluated.  She did have a urine and a urine pregnancy drawn prior to her leaving.  I will have the staff follow-up with her with those results.  There was no review of systems or physical exam able to be obtained from the patient due to significant issues with her attitude.  Please see the triage note for additional information that was not able to be obtained from the patient.   Past Medical History:  Diagnosis Date   BV (bacterial vaginosis)     There are no problems to display for this patient.   Past Surgical History:  Procedure Laterality Date   NO PAST SURGERIES      OB History   No obstetric history on file.      Home Medications    Prior to Admission medications   Medication Sig Start Date  End Date Taking? Authorizing Provider  cetirizine-pseudoephedrine (ZYRTEC-D) 5-120 MG tablet Take 1 tablet by mouth 2 (two) times daily. 01/01/21   Tommie Sams, DO  ESTRADIOL-NORGESTIMATE PO norgestimate 0.25 mg-ethinyl estradiol 35 mcg tablet  TAKE 1 TABLET BY MOUTH EVERY DAY    [provider]  ketorolac (TORADOL) 10 MG tablet Take 1 tablet (10 mg total) by mouth every 6 (six) hours as needed for moderate pain or severe pain. 01/01/21   Tommie Sams, DO  propranolol ER (INDERAL LA) 60 MG 24 hr capsule TK 1 C PO QD IN THE EVE 12/17/18 09/27/19  [provider]    Family History Family History  Problem Relation Age of Onset   Hypertension Mother    Hypertension Father     Social History Social History   Tobacco Use   Smoking status: Former    Types: Cigarettes    Quit date: 10/13/2013    Years since quitting: 7.4   Smokeless tobacco: Never  Vaping Use   Vaping Use: Every day   Substances: Nicotine, Flavoring  Substance Use Topics   Alcohol use: Yes    Comment: occasional   Drug use: No     Allergies  Patient has no known allergies.   Review of Systems Review of Systems   Physical Exam Triage Vital Signs ED Triage Vitals  Enc Vitals Group     BP 03/13/21 1208 131/82     Pulse Rate 03/13/21 1208 93     Resp 03/13/21 1208 18     Temp 03/13/21 1208 98.4 F (36.9 C)     Temp Source 03/13/21 1208 Oral     SpO2 03/13/21 1208 97 %     Weight 03/13/21 1205 120 lb (54.4 kg)     Height 03/13/21 1205 5\' 2"  (1.575 m)     Head Circumference --      Peak Flow --      Pain Score 03/13/21 1204 8     Pain Loc --      Pain Edu? --      Excl. in GC? --    No data found.  Updated Vital Signs BP 131/82 (BP Location: Left Arm)   Pulse 93   Temp 98.4 F (36.9 C) (Oral)   Resp 18   Ht 5\' 2"  (1.575 m)   Wt 54.4 kg   LMP  (LMP Unknown) Comment: pt states she has not had a period in several months, does report occasional spotting  SpO2 97%   BMI 21.95  kg/m   Visual Acuity Right Eye Distance:   Left Eye Distance:   Bilateral Distance:    Right Eye Near:   Left Eye Near:    Bilateral Near:     Physical Exam   UC Treatments / Results  Labs (all labs ordered are listed, but only abnormal results are displayed) Labs Reviewed  URINALYSIS, COMPLETE (UACMP) WITH MICROSCOPIC - Abnormal; Notable for the following components:      Result Value   Hgb urine dipstick TRACE (*)    Bacteria, UA RARE (*)    All other components within normal limits  PREGNANCY, URINE    EKG   Radiology No results found.  Procedures Procedures (including critical care time)  Medications Ordered in UC Medications - No data to display  Initial Impression / Assessment and Plan / UC Course  I have reviewed the triage vital signs and the nursing notes.  Pertinent labs & imaging results that were available during my care of the patient were reviewed by me and considered in my medical decision making (see chart for details).  Clinical impression: 1.  Lower abdominal pain 2.  Low back pain 3.  Urinary frequency 4.  Nausea without vomiting  Treatment plan: 1.  UA and urine pregnancy test were obtained by the medical staff.  No evidence of UTI.  Urine pregnancy test was negative. 2.  As indicated in the HPI during the history patient got fairly belligerent, raised her voice, and used profanity.  She decided to leave.  With the profanity in the hallway the security guard escort her off the premises.  She left without being seen and left AGAINST MEDICAL ADVICE. 3.  We will have the staff contact her and let her know the results of her UA and her urine pregnancy test.    Final Clinical Impressions(s) / UC Diagnoses   Final diagnoses:  Lower abdominal pain  Low back pain, unspecified back pain laterality, unspecified chronicity, unspecified whether sciatica present  Urinary frequency  Nausea without vomiting     Discharge Instructions       Patient left without being fully evaluated.  No physical exam or review of systems was  able to be obtained.  Patient was using vulgarity and raising her voice at which time security escorted her off the premises.  Patient left AGAINST MEDICAL ADVICE.     ED Prescriptions   None    PDMP not reviewed this encounter.   Delton See, MD 03/13/21 1248

## 2021-03-13 NOTE — ED Triage Notes (Addendum)
Pt c/o lower back pain, stomach cramps and headache for several weeks. Pt states she was unable to get out of the bed this morning, states she was crying. Pt does report some urinary frequency and new onset constipation. Pt states the only time she has a BM is when she eats something green. Pt states this has been present for "a week or 2". Pt also reports some nausea, denies f/c/v or other symptoms. Pt reports she stopped her oral BC several months ago, is actively trying to get pregnant. States she has not had a normal period since maybe May, has had some occasional spotting.

## 2021-03-13 NOTE — Discharge Instructions (Addendum)
Patient left without being fully evaluated.  No physical exam or review of systems was able to be obtained.  Patient was using vulgarity and raising her voice at which time security escorted her off the premises.  Patient left AGAINST MEDICAL ADVICE.

## 2021-04-15 ENCOUNTER — Encounter: Payer: Self-pay | Admitting: Obstetrics and Gynecology

## 2021-04-15 ENCOUNTER — Encounter: Payer: 59 | Admitting: Obstetrics and Gynecology

## 2021-05-15 ENCOUNTER — Encounter: Payer: 59 | Admitting: Obstetrics and Gynecology

## 2021-10-07 ENCOUNTER — Encounter: Payer: Self-pay | Admitting: Emergency Medicine

## 2021-10-07 ENCOUNTER — Ambulatory Visit
Admission: EM | Admit: 2021-10-07 | Discharge: 2021-10-07 | Disposition: A | Discharge status: Home / Self Care | Payer: 59 | Attending: Physician Assistant | Admitting: Physician Assistant

## 2021-10-07 ENCOUNTER — Other Ambulatory Visit: Payer: Self-pay

## 2021-10-07 DIAGNOSIS — J029 Acute pharyngitis, unspecified: Secondary | ICD-10-CM | POA: Diagnosis not present

## 2021-10-07 DIAGNOSIS — T7840XA Allergy, unspecified, initial encounter: Secondary | ICD-10-CM | POA: Diagnosis not present

## 2021-10-07 DIAGNOSIS — H9203 Otalgia, bilateral: Secondary | ICD-10-CM | POA: Diagnosis not present

## 2021-10-07 LAB — GROUP A STREP BY PCR: Group A Strep by PCR: NOT DETECTED

## 2021-10-07 NOTE — ED Provider Notes (Signed)
?[facility] ? ? ? ?CSN: [id number] ?Arrival date & time: 10/07/21  1807 ? ? ?  ? ?History   ?Chief Complaint ?Chief Complaint  ?Patient presents with  ? Otalgia  ? Sore Throat  ? ? ?HPI ?Candace Curtis is a 26 y.o. female presenting for 3 to 4-day history of bilateral ear pain/pressure and sore throat and painful swallowing.  Patient also reports having a headache and being a little fatigued.  No cough or congestion reported.  No fever, chills.  No breathing difficulty, vomiting or diarrhea.  No sick contacts.  Has history of allergies.  Tried Mucinex with no improvement in symptoms.  No other complaints. ? ?HPI ? ?Past Medical History:  ?Diagnosis Date  ? BV (bacterial vaginosis)   ? ? ?There are no problems to display for this patient. ? ? ?Past Surgical History:  ?Procedure Laterality Date  ? NO PAST SURGERIES    ? ? ?OB History   ?No obstetric history on file. ?  ? ? ? ?Home Medications   ? ?Prior to Admission medications   ?Medication Sig Start Date End Date Taking? Authorizing Provider  ?ESTRADIOL-NORGESTIMATE PO norgestimate 0.25 mg-ethinyl estradiol 35 mcg tablet ? TAKE 1 TABLET BY MOUTH EVERY DAY    [provider]  ?ketorolac (TORADOL) 10 MG tablet Take 1 tablet (10 mg total) by mouth every 6 (six) hours as needed for moderate pain or severe pain. 01/01/21   Tommie Samsook, Jayce G, DO  ?propranolol ER (INDERAL LA) 60 MG 24 hr capsule TK 1 C PO QD IN THE EVE 12/17/18 09/27/19  [provider]  ? ? ?Family History ?Family History  ?Problem Relation Age of Onset  ? Hypertension Mother   ? Hypertension Father   ? ? ?Social History ?Social History  ? ?Tobacco Use  ? Smoking status: Former  ?  Types: Cigarettes  ?  Quit date: 10/13/2013  ?  Years since quitting: 7.9  ? Smokeless tobacco: Never  ?Vaping Use  ? Vaping Use: Every day  ? Substances: Nicotine, Flavoring  ?Substance Use Topics  ? Alcohol use: Yes  ?  Comment: occasional  ? Drug use: No  ? ? ? ?Allergies   ?Patient has no known  allergies. ? ? ?Review of Systems ?Review of Systems  ?Constitutional:  Negative for chills, diaphoresis, fatigue and fever.  ?HENT:  Positive for ear pain and sore throat. Negative for congestion, rhinorrhea, sinus pressure and sinus pain.   ?Respiratory:  Negative for cough and shortness of breath.   ?Gastrointestinal:  Negative for abdominal pain, nausea and vomiting.  ?Musculoskeletal:  Negative for arthralgias and myalgias.  ?Skin:  Negative for rash.  ?Neurological:  Negative for weakness and headaches.  ?Hematological:  Negative for adenopathy.  ? ? ?Physical Exam ?Triage Vital Signs ?ED Triage Vitals  ?Enc Vitals Group  ?   BP   ?   Pulse   ?   Resp   ?   Temp   ?   Temp src   ?   SpO2   ?   Weight   ?   Height   ?   Head Circumference   ?   Peak Flow   ?   Pain Score   ?   Pain Loc   ?   Pain Edu?   ?   Excl. in GC?   ? ?No data found. ? ?Updated Vital Signs ?BP (!) 130/99 (BP Location: Right Arm)   Pulse 93  Temp 98.5 ?F (36.9 ?C) (Oral)   Resp 18   Ht 5\' 2"  (1.575 m)   Wt 119 lb 14.9 oz (54.4 kg)   LMP 10/05/2021 (Approximate)   SpO2 100%   BMI 21.94 kg/m?  ?   ? ?Physical Exam ?Vitals and nursing note reviewed.  ?Constitutional:   ?   General: She is not in acute distress. ?   Appearance: Normal appearance. She is not ill-appearing or toxic-appearing.  ?HENT:  ?   Head: Normocephalic and atraumatic.  ?   Right Ear: Tympanic membrane, ear canal and external ear normal.  ?   Left Ear: Tympanic membrane, ear canal and external ear normal.  ?   Nose: Nose normal.  ?   Mouth/Throat:  ?   Mouth: Mucous membranes are moist.  ?   Pharynx: Oropharynx is clear. Posterior oropharyngeal erythema (mild with clear PND) present.  ?Eyes:  ?   General: No scleral icterus.    ?   Right eye: No discharge.     ?   Left eye: No discharge.  ?   Conjunctiva/sclera: Conjunctivae normal.  ?Cardiovascular:  ?   Rate and Rhythm: Normal rate and regular rhythm.  ?   Heart sounds: Normal heart sounds.  ?Pulmonary:  ?    Effort: Pulmonary effort is normal. No respiratory distress.  ?   Breath sounds: Normal breath sounds.  ?Musculoskeletal:  ?   Cervical back: Neck supple.  ?Skin: ?   General: Skin is dry.  ?Neurological:  ?   General: No focal deficit present.  ?   Mental Status: She is alert. Mental status is at baseline.  ?   Motor: No weakness.  ?   Gait: Gait normal.  ?Psychiatric:     ?   Mood and Affect: Mood normal.     ?   Behavior: Behavior normal.     ?   Thought Content: Thought content normal.  ? ? ? ?UC Treatments / Results  ?Labs ?(all labs ordered are listed, but only abnormal results are displayed) ?Labs Reviewed  ?GROUP A STREP BY PCR  ? ? ?EKG ? ? ?Radiology ?No results found. ? ?Procedures ?Procedures (including critical care time) ? ?Medications Ordered in UC ?Medications - No data to display ? ?Initial Impression / Assessment and Plan / UC Course  ?I have reviewed the triage vital signs and the nursing notes. ? ?Pertinent labs & imaging results that were available during my care of the patient were reviewed by me and considered in my medical decision making (see chart for details). ? ?26 year old female presenting for sore throat and bilateral ear pain/pressure for the past 3 to 4 days.  No associated fever, cough or congestion.  History of allergies.  Vitals stable.  She is afebrile.  She is overall well-appearing but does appear to be somewhat fatigued.  Exam significant for mild posterior pharyngeal erythema with clear postnasal drainage, otherwise normal.  PCR strep test performed.  Negative.  Discussed result with patient.  Advised symptoms likely related to allergies or virus.  Advise switching to over-the-counter Allegra-D and Flonase, Chloraseptic spray and cough drops as needed.  Plenty of rest and fluids.  Reviewed return precautions.  School note given. ? ? ?Final Clinical Impressions(s) / UC Diagnoses  ? ?Final diagnoses:  ?Acute pharyngitis, unspecified etiology  ?Acute ear pain, bilateral   ? ? ? ?Discharge Instructions   ? ?  ?-Negative strep ?-Symptoms may be due to a virus or your allergies ?-Start  antihistamine/decongestant (allegra d) and nasal sprays (flonase, nasal saline). Can also try tylenol/motrin, chloraseptic spray. Plenty of rest and fluids ?-should start feeling better over the next week. If fever or worsening symptoms, need to be seen again. ? ? ? ? ?ED Prescriptions   ?None ?  ? ?PDMP not reviewed this encounter. ?  ?Shirlee Latch, PA-C ?10/07/21 1909 ? ?

## 2021-10-07 NOTE — Discharge Instructions (Addendum)
-  Negative strep ?-Symptoms may be due to a virus or your allergies ?-Start antihistamine/decongestant (allegra d) and nasal sprays (flonase, nasal saline). Can also try tylenol/motrin, chloraseptic spray. Plenty of rest and fluids ?-should start feeling better over the next week. If fever or worsening symptoms, need to be seen again. ?

## 2021-10-07 NOTE — ED Triage Notes (Signed)
Pt c/o bilateral ear pain, started yesterday. Also having sore throat, headache for 3 days ago. Denies other uri symptoms or fever.  ?

## 2022-04-08 ENCOUNTER — Ambulatory Visit (LOCAL_COMMUNITY_HEALTH_CENTER): Payer: Self-pay

## 2022-04-08 VITALS — BP 114/68 | Ht 62.0 in | Wt 133.0 lb

## 2022-04-08 DIAGNOSIS — Z3201 Encounter for pregnancy test, result positive: Secondary | ICD-10-CM

## 2022-04-08 MED ORDER — PRENATAL 27-0.8 MG PO TABS: 1.0000 | ORAL_TABLET | Freq: Every day | ORAL | 0 refills | Status: AC

## 2022-04-08 NOTE — Progress Notes (Signed)
UPT positive. Unsure where she plans prenatal care. Local prenatal provider list given.   The patient was dispensed prenatal vitamins #100 today. I provided counseling today regarding the medication. We discussed the medication, the side effects and when to call clinic. Patient given the opportunity to ask questions. Questions answered.    Sent to [facility] for medicaid/preg women.Josie Saunders, RN

## 2022-04-09 LAB — PREGNANCY, URINE: Preg Test, Ur: POSITIVE — AB

## 2022-07-05 ENCOUNTER — Ambulatory Visit
Admission: EM | Admit: 2022-07-05 | Discharge: 2022-07-05 | Disposition: A | Discharge status: Home / Self Care | Payer: Medicaid Other | Attending: Physician Assistant | Admitting: Physician Assistant

## 2022-07-05 ENCOUNTER — Encounter: Payer: Self-pay | Admitting: Emergency Medicine

## 2022-07-05 DIAGNOSIS — Z3A18 18 weeks gestation of pregnancy: Secondary | ICD-10-CM | POA: Insufficient documentation

## 2022-07-05 DIAGNOSIS — U071 COVID-19: Secondary | ICD-10-CM | POA: Diagnosis not present

## 2022-07-05 DIAGNOSIS — R051 Acute cough: Secondary | ICD-10-CM

## 2022-07-05 LAB — RAPID INFLUENZA A&B ANTIGENS
Influenza A (ARMC): NEGATIVE
Influenza B (ARMC): NEGATIVE

## 2022-07-05 LAB — SARS CORONAVIRUS 2 BY RT PCR: SARS Coronavirus 2 by RT PCR: POSITIVE — AB

## 2022-07-05 NOTE — ED Provider Notes (Signed)
[facility]    CSN: [id number] Arrival date & time: 07/05/22  0815      History   Chief Complaint Chief Complaint  Patient presents with   Headache   Nasal Congestion   Cough    HPI Candace Curtis is a 26 y.o. female currently [redacted] weeks pregnant.  She presents today for 3-day history of feeling feverish with fatigue, chills, body aches, cough, congestion and sore throat.  No wheezing or breathing difficulty, vomiting or diarrhea.  No abdominal pain or abnormal bleeding reported.  No sick contacts.  No known exposure to flu or COVID.  Taking over-the-counter cough medication.  No other complaints.  HPI  Past Medical History:  Diagnosis Date   BV (bacterial vaginosis)     There are no problems to display for this patient.   Past Surgical History:  Procedure Laterality Date   NO PAST SURGERIES      OB History     Gravida  4   Para  0   Term  0   Preterm  0   AB  3   Living  0      SAB  0   IAB  3   Ectopic  0   Multiple  0   Live Births  0            Home Medications    Prior to Admission medications   Medication Sig Start Date End Date Taking? Authorizing Provider  Prenatal Vit-Fe Fumarate-FA (MULTIVITAMIN-PRENATAL) 27-0.8 MG TABS tablet Take 1 tablet by mouth daily at 12 noon. 04/08/22 07/17/22 Yes Caren Macadam, MD  ESTRADIOL-NORGESTIMATE PO norgestimate 0.25 mg-ethinyl estradiol 35 mcg tablet  TAKE 1 TABLET BY MOUTH EVERY DAY Patient not taking: Reported on 04/08/2022    [provider]  ketorolac (TORADOL) 10 MG tablet Take 1 tablet (10 mg total) by mouth every 6 (six) hours as needed for moderate pain or severe pain. Patient not taking: Reported on 04/08/2022 01/01/21   Coral Spikes, DO  propranolol ER (INDERAL LA) 60 MG 24 hr capsule TK 1 C PO QD IN THE EVE 12/17/18 09/27/19  [provider]    Family History Family History  Problem Relation Age of Onset   Hypertension Mother    Hypertension Father      Social History Social History   Tobacco Use   Smoking status: Former    Types: Cigarettes    Quit date: 10/13/2013    Years since quitting: 8.7   Smokeless tobacco: Never  Vaping Use   Vaping Use: Former   Substances: Nicotine, Flavoring  Substance Use Topics   Alcohol use: Not Currently    Comment: last use one week ago   Drug use: No     Allergies   Patient has no known allergies.   Review of Systems Review of Systems  Constitutional:  Positive for chills and fatigue. Negative for diaphoresis and fever.  HENT:  Positive for congestion, rhinorrhea and sore throat. Negative for ear pain, sinus pressure and sinus pain.   Respiratory:  Positive for cough. Negative for shortness of breath.   Gastrointestinal:  Negative for abdominal pain, nausea and vomiting.  Musculoskeletal:  Positive for myalgias. Negative for arthralgias.  Skin:  Negative for rash.  Neurological:  Negative for weakness and headaches.  Hematological:  Negative for adenopathy.     Physical Exam Triage Vital Signs ED Triage Vitals  Enc Vitals Group     BP 07/05/22 0843 98/64  Pulse Rate 07/05/22 0843 96     Resp 07/05/22 0843 14     Temp 07/05/22 0843 99.2 F (37.3 C)     Temp Source 07/05/22 0843 Oral     SpO2 07/05/22 0843 98 %     Weight 07/05/22 0840 145 lb (65.8 kg)     Height 07/05/22 0840 5\' 2"  (1.575 m)     Head Circumference --      Peak Flow --      Pain Score 07/05/22 0840 10     Pain Loc --      Pain Edu? --      Excl. in [place]? --    No data found.  Updated Vital Signs BP 98/64 (BP Location: Left Arm)   Pulse 96   Temp 99.2 F (37.3 C) (Oral)   Resp 14   Ht 5\' 2"  (1.575 m)   Wt 145 lb (65.8 kg)   LMP 02/25/2022 (Exact Date)   SpO2 98%   BMI 26.52 kg/m     Physical Exam Vitals and nursing note reviewed.  Constitutional:      General: She is not in acute distress.    Appearance: Normal appearance. She is ill-appearing. She is not toxic-appearing.  HENT:      Head: Normocephalic and atraumatic.     Nose: Congestion and rhinorrhea (clear) present.     Mouth/Throat:     Mouth: Mucous membranes are moist.     Pharynx: Oropharynx is clear. Posterior oropharyngeal erythema present.  Eyes:     General: No scleral icterus.       Right eye: No discharge.        Left eye: No discharge.     Conjunctiva/sclera: Conjunctivae normal.  Cardiovascular:     Rate and Rhythm: Normal rate and regular rhythm.     Heart sounds: Normal heart sounds.  Pulmonary:     Effort: Pulmonary effort is normal. No respiratory distress.     Breath sounds: Normal breath sounds.  Musculoskeletal:     Cervical back: Neck supple.  Skin:    General: Skin is dry.  Neurological:     General: No focal deficit present.     Mental Status: She is alert. Mental status is at baseline.     Motor: No weakness.     Gait: Gait normal.  Psychiatric:        Mood and Affect: Mood normal.        Behavior: Behavior normal.        Thought Content: Thought content normal.      UC Treatments / Results  Labs (all labs ordered are listed, but only abnormal results are displayed) Labs Reviewed  SARS CORONAVIRUS 2 BY RT PCR - Abnormal; Notable for the following components:      Result Value   SARS Coronavirus 2 by RT PCR POSITIVE (*)    All other components within normal limits  RAPID INFLUENZA A&B ANTIGENS    EKG   Radiology No results found.  Procedures Procedures (including critical care time)  Medications Ordered in UC Medications - No data to display  Initial Impression / Assessment and Plan / UC Course  I have reviewed the triage vital signs and the nursing notes.  Pertinent labs & imaging results that were available during my care of the patient were reviewed by me and considered in my medical decision making (see chart for details).   26 year old female who is currently [redacted] weeks pregnant presents for cough, congestion,  feeling feverish, fatigue, chills, sore throat  x 3 days.  Vitals are stable.  She is ill-appearing but nontoxic.  No acute distress.  On exam she has nasal congestion with clear drainage.  Mild erythema posterior pharynx.  Chest clear to auscultation heart regular rate and rhythm.  Flu test negative.  Positive COVID test.  Reviewed current CDC guidelines, isolation protocol and ED precautions with patient.  Advised her to reach out to her OB/GYN and let them know that she was positive for COVID today.  Supportive care advised.  Advised Robitussin, Tylenol, Flonase, nasal saline, vitamin C, Chloraseptic spray and throat lozenges as well as plenty rest and fluids.  Reviewed return and ER precautions.   Final Clinical Impressions(s) / UC Diagnoses   Final diagnoses:  COVID-19  Acute cough  [redacted] weeks gestation of pregnancy     Discharge Instructions      -You are positive for COVID.  Need to isolate 5 days or mass x 5 days.  You should isolate until Monday and then wear a mask for 5 days after that.  Plenty rest and fluids. - Robitussin, Tylenol, Flonase, nasal saline, vitamin C, Chloraseptic spray, throat lozenges are all fine in pregnancy. - Need to be seen again in the ER if you develop any uncontrolled fever, weakness, shortness of breath or abnormal bleeding. -Please inform your OB/GYN that you are positive for COVID-19 to see if they have any further guidance for you.     ED Prescriptions   None    PDMP not reviewed this encounter.   Shirlee Latch, PA-C 07/05/22 [phone]

## 2022-07-05 NOTE — Discharge Instructions (Addendum)
-  You are positive for COVID.  Need to isolate 5 days or mass x 5 days.  You should isolate until Monday and then wear a mask for 5 days after that.  Plenty rest and fluids. - Robitussin, Tylenol, Flonase, nasal saline, vitamin C, Chloraseptic spray, throat lozenges are all fine in pregnancy. - Need to be seen again in the ER if you develop any uncontrolled fever, weakness, shortness of breath or abnormal bleeding. -Please inform your OB/GYN that you are positive for COVID-19 to see if they have any further guidance for you.

## 2022-07-05 NOTE — ED Triage Notes (Signed)
Patient c/o cough, nasal congestion, bodyaches and fever that started 3 days ago.

## 2023-12-20 ENCOUNTER — Encounter: Payer: Self-pay | Admitting: Emergency Medicine

## 2023-12-20 ENCOUNTER — Other Ambulatory Visit: Payer: Self-pay

## 2023-12-20 ENCOUNTER — Inpatient Hospital Stay
Admission: EM | Admit: 2023-12-20 | Discharge: 2023-12-22 | DRG: 760 | Disposition: A | Discharge status: Home / Self Care | Attending: Obstetrics and Gynecology | Admitting: Obstetrics and Gynecology

## 2023-12-20 ENCOUNTER — Emergency Department

## 2023-12-20 DIAGNOSIS — I10 Essential (primary) hypertension: Secondary | ICD-10-CM | POA: Diagnosis present

## 2023-12-20 DIAGNOSIS — E876 Hypokalemia: Secondary | ICD-10-CM | POA: Diagnosis present

## 2023-12-20 DIAGNOSIS — D219 Benign neoplasm of connective and other soft tissue, unspecified: Secondary | ICD-10-CM | POA: Diagnosis present

## 2023-12-20 DIAGNOSIS — D649 Anemia, unspecified: Secondary | ICD-10-CM

## 2023-12-20 DIAGNOSIS — Z8249 Family history of ischemic heart disease and other diseases of the circulatory system: Secondary | ICD-10-CM

## 2023-12-20 DIAGNOSIS — D62 Acute posthemorrhagic anemia: Secondary | ICD-10-CM | POA: Diagnosis present

## 2023-12-20 DIAGNOSIS — N920 Excessive and frequent menstruation with regular cycle: Secondary | ICD-10-CM | POA: Diagnosis not present

## 2023-12-20 DIAGNOSIS — D259 Leiomyoma of uterus, unspecified: Secondary | ICD-10-CM | POA: Diagnosis present

## 2023-12-20 DIAGNOSIS — R000 Tachycardia, unspecified: Secondary | ICD-10-CM | POA: Diagnosis present

## 2023-12-20 DIAGNOSIS — N939 Abnormal uterine and vaginal bleeding, unspecified: Principal | ICD-10-CM | POA: Diagnosis present

## 2023-12-20 DIAGNOSIS — Z87891 Personal history of nicotine dependence: Secondary | ICD-10-CM

## 2023-12-20 LAB — BASIC METABOLIC PANEL WITH GFR
Anion gap: 13 (ref 5–15)
BUN: 7 mg/dL (ref 6–20)
CO2: 23 mmol/L (ref 22–32)
Calcium: 9.3 mg/dL (ref 8.9–10.3)
Chloride: 104 mmol/L (ref 98–111)
Creatinine, Ser: 0.84 mg/dL (ref 0.44–1.00)
GFR, Estimated: >60 mL/min (ref >60)
Glucose, Bld: 99 mg/dL (ref 70–99)
Potassium: 3.2 mmol/L — ABNORMAL LOW (ref 3.5–5.1)
Sodium: 140 mmol/L (ref 135–145)

## 2023-12-20 LAB — CBC
HCT: 35.5 % — ABNORMAL LOW (ref 36.0–46.0)
Hemoglobin: 12.3 g/dL (ref 12.0–15.0)
MCH: 32.4 pg (ref 26.0–34.0)
MCHC: 34.6 g/dL (ref 30.0–36.0)
MCV: 93.4 fL (ref 80.0–100.0)
Platelets: 318 10*3/uL (ref 150–400)
RBC: 3.8 MIL/uL — ABNORMAL LOW (ref 3.87–5.11)
RDW: 12.5 % (ref 11.5–15.5)
WBC: 9 10*3/uL (ref 4.0–10.5)
nRBC: 0 % (ref 0.0–0.2)

## 2023-12-20 LAB — POC URINE PREG, ED: Preg Test, Ur: NEGATIVE

## 2023-12-20 MED ORDER — SODIUM CHLORIDE 0.9 % IV BOLUS (SEPSIS)
1000.0000 mL | Freq: Once | INTRAVENOUS | Status: AC
  Administered 2023-12-21: 1000 mL via INTRAVENOUS

## 2023-12-20 MED ORDER — KETOROLAC TROMETHAMINE 30 MG/ML IJ SOLN
30.0000 mg | Freq: Once | INTRAMUSCULAR | Status: AC
  Administered 2023-12-21: 30 mg via INTRAVENOUS
  Filled 2023-12-20: qty 1

## 2023-12-20 MED ORDER — TRANEXAMIC ACID 650 MG PO TABS
1300.0000 mg | ORAL_TABLET | Freq: Once | ORAL | Status: AC
  Administered 2023-12-21: 1300 mg via ORAL
  Filled 2023-12-20: qty 2

## 2023-12-20 NOTE — ED Notes (Addendum)
 Pt stated she started the [place] Ring 3 months ago but has never had vaginal bleeding before like she is experiencing. Pt stated she wasn't due for her period to start for another week. Pt experiencing intense abdominal cramping.   Boyfriend at bedside.

## 2023-12-20 NOTE — ED Triage Notes (Signed)
 Pt c/o heavy vaginal bleeding since yesterday with large clots. She is having some abdominal cramping, slightly worse than usual menstrual cramps. States she took a pregnancy test at home that was negative.

## 2023-12-20 NOTE — ED Notes (Signed)
 Pt given a gown in instructed to  remove clothing from the waist down.

## 2023-12-20 NOTE — ED Notes (Signed)
 Pt provided urine specimen that was frankly bloody with large clots and very little liquid available for aliquot sample.

## 2023-12-20 NOTE — ED Provider Notes (Signed)
 [facility] Provider Note    Event Date/Time   First MD Initiated Contact with Patient 12/20/23 2326     (approximate)   History   Vaginal Bleeding   HPI  Candace Curtis is a 28 y.o. female [phone] with no past medical history who presents to the emergency department with complaints of heavy vaginal bleeding.  She states bleeding started yesterday and progressively increased over the past few hours.  She is now soaking through several pads with clots.  States she is feeling weak, lightheaded.  No chest pain or shortness of breath.  She states that about a week early for her menstrual cycle.  She took a pregnancy test at home and it was negative.  She started using NuvaRing about 3 months ago.  Her OB/GYN is in [place].  She has never had heavy bleeding like this before.  She is not on any blood thinners.  She is experiencing lower abdominal cramping.  No dysuria, other abnormal discharge.   History provided by patient, significant other.    Past Medical History:  Diagnosis Date   BV (bacterial vaginosis)     Past Surgical History:  Procedure Laterality Date   NO PAST SURGERIES      MEDICATIONS:  Prior to Admission medications   Medication Sig Start Date End Date Taking? Authorizing Provider  ESTRADIOL-NORGESTIMATE PO norgestimate 0.25 mg-ethinyl estradiol 35 mcg tablet  TAKE 1 TABLET BY MOUTH EVERY DAY Patient not taking: Reported on 04/08/2022    [provider]  ketorolac  (TORADOL ) 10 MG tablet Take 1 tablet (10 mg total) by mouth every 6 (six) hours as needed for moderate pain or severe pain. Patient not taking: Reported on 04/08/2022 01/01/21   Cook, Jayce G, DO  propranolol ER (INDERAL LA) 60 MG 24 hr capsule TK 1 C PO QD IN THE EVE 12/17/18 09/27/19  [provider]    Physical Exam   Triage Vital Signs: ED Triage Vitals  Encounter Vitals Group     BP 12/20/23 2052 (!) 143/96     Systolic BP Percentile --      Diastolic BP  Percentile --      Pulse Rate 12/20/23 2052 (!) 130     Resp 12/20/23 2052 18     Temp 12/20/23 2052 98.9 F (37.2 C)     Temp Source 12/20/23 2052 Oral     SpO2 12/20/23 2052 98 %     Weight 12/20/23 2053 140 lb (63.5 kg)     Height 12/20/23 2053 5\' 2"  (1.575 m)     Head Circumference --      Peak Flow --      Pain Score 12/20/23 2053 10     Pain Loc --      Pain Education --      Exclude from Growth Chart --     Most recent vital signs: Vitals:   12/21/23 0340 12/21/23 0358  BP: (!) 135/98 (!) 138/95  Pulse: 85 85  Resp: 18 20  Temp: 99.1 F (37.3 C) 98.8 F (37.1 C)  SpO2: 99% 98%    CONSTITUTIONAL: Alert, responds appropriately to questions. Well-appearing; well-nourished HEAD: Normocephalic, atraumatic EYES: Conjunctivae clear, pupils appear equal, sclera nonicteric ENT: normal nose; moist mucous membranes NECK: Supple, normal ROM CARD: Regular and tachycardic; S1 and S2 appreciated RESP: Normal chest excursion without splinting or tachypnea; breath sounds clear and equal bilaterally; no wheezes, no rhonchi, no rales, no hypoxia or respiratory distress, speaking full sentences  ABD/GI: Non-distended; soft, tender throughout the lower abdomen without guarding or rebound GU:  Normal external genitalia. No lesions, rashes noted. Patient has large amount of dark red vaginal bleeding, from the cervical os on exam.  There are large clots noted in the posterior vaginal vault.  No other appreciable vaginal discharge.  Patient does seem to have increased pain with speculum insertion but no overt adnexal tenderness, fullness, mass or cervical motion tenderness.  Cervix appears closed but difficult to fully assess due to amount of bleeding present.  Chaperone present for exam. BACK: The back appears normal EXT: Normal ROM in all joints; no deformity noted, no edema SKIN: Normal color for age and race; warm; no rash on exposed skin NEURO: Moves all extremities equally, normal  speech PSYCH: The patient's mood and manner are appropriate.   ED Results / Procedures / Treatments   LABS: (all labs ordered are listed, but only abnormal results are displayed) Labs Reviewed  CBC - Abnormal; Notable for the following components:      Result Value   RBC 3.80 (*)    HCT 35.5 (*)    All other components within normal limits  BASIC METABOLIC PANEL WITH GFR - Abnormal; Notable for the following components:   Potassium 3.2 (*)    All other components within normal limits  HEMOGLOBIN AND HEMATOCRIT, BLOOD - Abnormal; Notable for the following components:   Hemoglobin 9.5 (*)    HCT 27.5 (*)    All other components within normal limits  POC URINE PREG, ED - Normal  WET PREP, GENITAL  CHLAMYDIA/NGC RT PCR ([facility] ONLY)            CBC  TYPE AND SCREEN  PREPARE RBC (CROSSMATCH)  ABO/RH     EKG:  EKG Interpretation Date/Time:    Ventricular Rate:    PR Interval:    QRS Duration:    QT Interval:    QTC Calculation:   R Axis:      Text Interpretation:           RADIOLOGY: My personal review and interpretation of imaging: Ultrasound shows uterine fibroid.  I have personally reviewed all radiology reports.   US  PELVIC COMPLETE W TRANSVAGINAL AND TORSION R/O Result Date: 12/21/2023 CLINICAL DATA:  Vaginal bleeding and clots EXAM: TRANSABDOMINAL AND TRANSVAGINAL ULTRASOUND OF PELVIS DOPPLER ULTRASOUND OF OVARIES TECHNIQUE: Both transabdominal and transvaginal ultrasound examinations of the pelvis were performed. Transabdominal technique was performed for global imaging of the pelvis including uterus, ovaries, adnexal regions, and pelvic cul-de-sac. Color and duplex Doppler ultrasound was utilized to evaluate blood flow to the ovaries. COMPARISON:  ultrasound 11/05/2016 FINDINGS: Uterus Measurements: 11.5 x 6.5 x 7.2 cm = volume: 280 mL. 7.8 x 5.4 x 6.0 cm heterogenous mass in the fundus. This is favored to represent a large fibroid. This was not seen on pelvic  ultrasound 11/05/2016. Endometrium Obscured by the mass. Right ovary Measurements: 2.9 x 1.5 x 1.8 cm = volume: 4.2 mL. Normal appearance/no adnexal mass. Left ovary Measurements: 3.9 x 2.5 x 2.6 cm = volume: 12.8 mL. Normal appearance/no adnexal mass. Pulsed Doppler evaluation of both ovaries demonstrates normal low-resistance arterial and venous waveforms. Other findings Small to moderate free fluid in the pelvis. IMPRESSION: 7.8 cm heterogenous mass in the fundus, favored to represent a large fibroid. This was not seen on pelvic ultrasound 11/05/2016. The mass obscures the endometrium. Consider further evaluation with sonohysterogram for confirmation prior to hysteroscopy. Endometrial sampling should also be considered if patient  is at high risk for endometrial carcinoma. (Ref: Radiological Reasoning: Algorithmic Workup of Abnormal Vaginal Bleeding with Endovaginal Sonography and Sonohysterography. AJR 2008; 782:N56-21) Electronically Signed   By: Rozell Cornet M.D.   On: 12/21/2023 00:42     PROCEDURES:  Critical Care performed: Yes, see critical care procedure note(s)   CRITICAL CARE Performed by: Starling Eck Alleya Demeter   Total critical care time: 30 minutes  Critical care time was exclusive of separately billable procedures and treating other patients.  Critical care was necessary to treat or prevent imminent or life-threatening deterioration.  Critical care was time spent personally by me on the following activities: development of treatment plan with patient and/or surrogate as well as nursing, discussions with consultants, evaluation of patient's response to treatment, examination of patient, obtaining history from patient or surrogate, ordering and performing treatments and interventions, ordering and review of laboratory studies, ordering and review of radiographic studies, pulse oximetry and re-evaluation of patient's condition.   Aaron Aas1-3 Lead EKG Interpretation  Performed by: Adore Kithcart, Clover Dao, DO Authorized by: Tanveer Dobberstein, Clover Dao, DO     Interpretation: normal     ECG rate:  85   ECG rate assessment: normal     Rhythm: sinus rhythm     Ectopy: none     Conduction: normal       IMPRESSION / MDM / ASSESSMENT AND PLAN / ED COURSE  I reviewed the triage vital signs and the nursing notes.    Patient here with complaints of generalized weakness, dizziness, heavy vaginal bleeding, lower abdominal cramping.  Tachycardic but hypertensive.  Not on blood thinners.  The patient is on the cardiac monitor to evaluate for evidence of arrhythmia and/or significant heart rate changes.   DIFFERENTIAL DIAGNOSIS (includes but not limited to):   Dysfunctional uterine bleeding, endometriosis, endometrial hyperplasia, endometrial cancer, uterine fibroids, symptomatic anemia, thrombocytopenia, coagulopathy, miscarriage, ectopic pregnancy, ruptured cyst, torsion   Patient's presentation is most consistent with acute presentation with potential threat to life or bodily function.   PLAN: Workup initiated from triage.  Initial hemoglobin 12.3 with normal platelets.  Heart rate was 130 on arrival and this has improved but blood pressure slowly dropping.  Will obtain transvaginal ultrasound with Doppler.  Will perform pelvic exam with cultures.  Pregnancy test today is negative.  Will give IV fluids, Toradol .   MEDICATIONS GIVEN IN ED: Medications  conjugated estrogens (PREMARIN) injection 25 mg (has no administration in time range)  lactated ringers infusion (has no administration in time range)  sodium chloride 0.9 % bolus 1,000 mL (0 mLs Intravenous Stopped 12/21/23 0238)  ketorolac  (TORADOL ) 30 MG/ML injection 30 mg (30 mg Intravenous Given 12/21/23 0018)  tranexamic acid (LYSTEDA) tablet 1,300 mg (1,300 mg Oral Given 12/21/23 0022)  morphine (PF) 4 MG/ML injection 4 mg (4 mg Intravenous Given 12/21/23 0204)  ondansetron (ZOFRAN) injection 4 mg (4 mg Intravenous Given 12/21/23 0204)  0.9 %  sodium  chloride infusion (10 mL/hr Intravenous New Bag/Given 12/21/23 0404)     ED COURSE: Patient's repeat hemoglobin has dropped to 9.5.  She did receive about 500 mL of IV fluids prior to this being drawn.  Some of this could be delusional but given significant drop with still heavy bleeding on exam, I am concerned that her hemoglobin is going to continue to drop because I do not think we have seen the true extent of her blood loss yet.  Her blood pressure is starting to drop more with the lowest at 97/61.  I did discuss the case with OB/GYN on-call.  We agreed given hypotension, symptomatic anemia with active bleeding and that she meets criteria for blood transfusion.  Will give 1 unit packed red blood cells now.  I did give a dose of oral TXA here without much change.  Her ultrasound was reviewed and interpreted by myself and the radiologist and shows uterine fibroid.  Endometrial lining not well assessed due to the fibroid.  Wet prep, gonorrhea chlamydia swabs unremarkable.  She does have generalized tenderness with insertion of the speculum but no overt cervical motion tenderness, adnexal tenderness.   CONSULTS: I did discuss the case with OB/GYN on-call Fraser Jackson, certified nurse midwife.  She discussed the case with Dr. Valera Gaster Schermerhorn with OB/GYN.  They recommend starting IV estrogen 25 mg every 6 hours for 24 hours.  They will admit to their service.  Appreciate their help.  Patient comfortable with this plan.   OUTSIDE RECORDS REVIEWED: Reviewed last OB/GYN note in March 2025.  Patient was prescribed mifepristone, misoprostol for an abortion at that time.  She states she has had a normal period since this medical abortion.       FINAL CLINICAL IMPRESSION(S) / ED DIAGNOSES   Final diagnoses:  Abnormal uterine bleeding (AUB)  Uterine leiomyoma, unspecified location  Symptomatic anemia     Rx / DC Orders   ED Discharge Orders     None        Note:  This document  was prepared using Dragon voice recognition software and may include unintentional dictation errors.   Lindberg Zenon, Clover Dao, DO 12/21/23 [phone]

## 2023-12-21 ENCOUNTER — Encounter: Payer: Self-pay | Admitting: Obstetrics and Gynecology

## 2023-12-21 DIAGNOSIS — R000 Tachycardia, unspecified: Secondary | ICD-10-CM | POA: Diagnosis present

## 2023-12-21 DIAGNOSIS — Z87891 Personal history of nicotine dependence: Secondary | ICD-10-CM | POA: Diagnosis not present

## 2023-12-21 DIAGNOSIS — I10 Essential (primary) hypertension: Secondary | ICD-10-CM | POA: Diagnosis present

## 2023-12-21 DIAGNOSIS — D62 Acute posthemorrhagic anemia: Secondary | ICD-10-CM | POA: Diagnosis present

## 2023-12-21 DIAGNOSIS — N920 Excessive and frequent menstruation with regular cycle: Secondary | ICD-10-CM | POA: Diagnosis present

## 2023-12-21 DIAGNOSIS — D259 Leiomyoma of uterus, unspecified: Secondary | ICD-10-CM | POA: Diagnosis present

## 2023-12-21 DIAGNOSIS — E876 Hypokalemia: Secondary | ICD-10-CM | POA: Diagnosis present

## 2023-12-21 DIAGNOSIS — Z8249 Family history of ischemic heart disease and other diseases of the circulatory system: Secondary | ICD-10-CM | POA: Diagnosis not present

## 2023-12-21 DIAGNOSIS — N939 Abnormal uterine and vaginal bleeding, unspecified: Principal | ICD-10-CM | POA: Diagnosis present

## 2023-12-21 LAB — CBC
HCT: 26.8 % — ABNORMAL LOW (ref 36.0–46.0)
Hemoglobin: 9.6 g/dL — ABNORMAL LOW (ref 12.0–15.0)
MCH: 32.9 pg (ref 26.0–34.0)
MCHC: 35.8 g/dL (ref 30.0–36.0)
MCV: 91.8 fL (ref 80.0–100.0)
Platelets: 182 10*3/uL (ref 150–400)
RBC: 2.92 MIL/uL — ABNORMAL LOW (ref 3.87–5.11)
RDW: 13.2 % (ref 11.5–15.5)
WBC: 9.3 10*3/uL (ref 4.0–10.5)
nRBC: 0 % (ref 0.0–0.2)

## 2023-12-21 LAB — PREPARE RBC (CROSSMATCH)

## 2023-12-21 LAB — WET PREP, GENITAL
Clue Cells Wet Prep HPF POC: NONE SEEN
Sperm: NONE SEEN
Trich, Wet Prep: NONE SEEN
WBC, Wet Prep HPF POC: <10 (ref <10)
Yeast Wet Prep HPF POC: NONE SEEN

## 2023-12-21 LAB — ABO/RH: ABO/RH(D): O POS

## 2023-12-21 LAB — CHLAMYDIA/NGC RT PCR (ARMC ONLY)
Chlamydia Tr: NOT DETECTED
N gonorrhoeae: NOT DETECTED

## 2023-12-21 LAB — HEMOGLOBIN AND HEMATOCRIT, BLOOD
HCT: 27.5 % — ABNORMAL LOW (ref 36.0–46.0)
HCT: 27.4 % — ABNORMAL LOW (ref 36.0–46.0)
Hemoglobin: 9.5 g/dL — ABNORMAL LOW (ref 12.0–15.0)
Hemoglobin: 9.6 g/dL — ABNORMAL LOW (ref 12.0–15.0)

## 2023-12-21 MED ORDER — LACTATED RINGERS IV SOLN: INTRAVENOUS | Status: DC

## 2023-12-21 MED ORDER — ACETAMINOPHEN 500 MG PO TABS
1000.0000 mg | ORAL_TABLET | Freq: Four times a day (QID) | ORAL | Status: DC | PRN
  Administered 2023-12-21 – 2023-12-22 (×2): 1000 mg via ORAL
  Filled 2023-12-21 (×2): qty 2

## 2023-12-21 MED ORDER — NORETHINDRONE-ETH ESTRADIOL 0.4-35 MG-MCG PO TABS
3.0000 | ORAL_TABLET | Freq: Every day | ORAL | Status: DC
  Administered 2023-12-21 – 2023-12-22 (×2): 3 via ORAL
  Filled 2023-12-21 (×3): qty 3

## 2023-12-21 MED ORDER — SODIUM CHLORIDE 0.9 % IV SOLN
10.0000 mL/h | Freq: Once | INTRAVENOUS | Status: AC
  Administered 2023-12-21: 10 mL/h via INTRAVENOUS

## 2023-12-21 MED ORDER — ONDANSETRON HCL 4 MG/2ML IJ SOLN
4.0000 mg | Freq: Once | INTRAMUSCULAR | Status: AC
  Administered 2023-12-21: 4 mg via INTRAVENOUS
  Filled 2023-12-21: qty 2

## 2023-12-21 MED ORDER — MORPHINE SULFATE (PF) 2 MG/ML IV SOLN
1.0000 mg | INTRAVENOUS | Status: DC | PRN
  Administered 2023-12-21 – 2023-12-22 (×4): 1 mg via INTRAVENOUS
  Filled 2023-12-21 (×5): qty 1

## 2023-12-21 MED ORDER — NORETHINDRONE-ETH ESTRADIOL 0.4-35 MG-MCG PO TABS
1.0000 | ORAL_TABLET | Freq: Every day | ORAL | Status: DC
  Filled 2023-12-21: qty 1

## 2023-12-21 MED ORDER — ESTROGENS CONJUGATED 25 MG IJ SOLR
25.0000 mg | Freq: Four times a day (QID) | INTRAMUSCULAR | Status: DC
  Administered 2023-12-21 (×2): 25 mg via INTRAVENOUS
  Filled 2023-12-21 (×2): qty 25

## 2023-12-21 MED ORDER — OXYCODONE HCL 5 MG PO TABS
5.0000 mg | ORAL_TABLET | ORAL | Status: DC | PRN
  Administered 2023-12-21: 5 mg via ORAL
  Filled 2023-12-21: qty 1

## 2023-12-21 MED ORDER — MORPHINE SULFATE (PF) 2 MG/ML IV SOLN: 2.0000 mg | INTRAVENOUS | Status: DC | PRN

## 2023-12-21 MED ORDER — ESTROGENS CONJUGATED 25 MG IJ SOLR: 25.0000 mg | Freq: Four times a day (QID) | INTRAMUSCULAR | Status: DC | PRN

## 2023-12-21 MED ORDER — MORPHINE SULFATE (PF) 4 MG/ML IV SOLN
4.0000 mg | Freq: Once | INTRAVENOUS | Status: AC
  Administered 2023-12-21: 4 mg via INTRAVENOUS
  Filled 2023-12-21: qty 1

## 2023-12-21 MED ORDER — DIPHENHYDRAMINE HCL 25 MG PO CAPS
25.0000 mg | ORAL_CAPSULE | ORAL | Status: AC
  Administered 2023-12-21: 25 mg via ORAL

## 2023-12-21 MED ORDER — IBUPROFEN 600 MG PO TABS
600.0000 mg | ORAL_TABLET | Freq: Four times a day (QID) | ORAL | Status: DC | PRN
  Administered 2023-12-21 – 2023-12-22 (×3): 600 mg via ORAL
  Filled 2023-12-21 (×3): qty 1

## 2023-12-21 MED ORDER — DIPHENHYDRAMINE HCL 25 MG PO CAPS
25.0000 mg | ORAL_CAPSULE | Freq: Four times a day (QID) | ORAL | Status: DC | PRN
  Administered 2023-12-21: 25 mg via ORAL
  Filled 2023-12-21: qty 1

## 2023-12-21 MED ORDER — ONDANSETRON 4 MG PO TBDP: 4.0000 mg | ORAL_TABLET | Freq: Four times a day (QID) | ORAL | Status: DC | PRN

## 2023-12-21 NOTE — Plan of Care (Signed)
 Patient has had minimal vaginal bleeding this shift, abdominal cramping pain controlled with PO tylenol  and motrin , tolerating regular diet, denies dizziness, headache, SOB, up to bathroom, voiding.  Erlene Hawks, RN 69/25 @1454 

## 2023-12-21 NOTE — ED Notes (Signed)
 Dr. Author Board and RN to bedside for pelvic exam.

## 2023-12-21 NOTE — H&P (Signed)
 GYN Progress Note Name: Candace Curtis MRN: [id number] Date of Service: 12/21/2023   CC: abnormal uterine bleeding   HPI: Candace Curtis is a 28 y.o. [phone] with a hx of migraines without aura, and HTN who presented to the hospital with heavy vaginal bleeding.   Patient presented to the ED earlier this morning with heavy vaginal bleeding. Vitals notable for tachycardia initially to 130 and hypertensive to 140s/90s. Labs: Hb 12.3 initially at 2056 > 9.5 (0050). BMP with K 3.2. UPT negative. GC/CT negative. Wet prep negative. Pelvis u/s showed a 11.5x6.5x7.2 cm uterus with a 7.8 x 5.4 x 6.0 cm fundal fibroid obscuring the endometrium, normal ovaries bilaterally. Received Iv Toradol , IV zofran, PO TX 1300mg , IV morphine 4 mg.  Reports was started on Nuvaring in April for birth control after an abortion. Reports prior to this regular monthly menses with 4d of bleeding. Reports regular menses with the Nuvaring only when she removes the ring. States she removed the ring last week and replaced it with a new one immediately. Reports starting bleeding like a period (with the ring in) on 6/7 and then bleeding became heavy yesterday afternoon. Yesterday evening was soaking through pads in less than one hour. Endorses uterine cramping- currently 5/10. Denies nausea, vomiting, CP, SOB, dysuria, hematuria. Regular daily Bms.    ROS: All other systems reviewed and negative   GYN Hx: - LMP: Patient's last menstrual period was 12/19/2023 (exact date).  - Menses: see HPI - Pap hx: +hx of abnormal paps- denies needing colpo, last pap 05/23/22 NILM, denies hx of excisional procedures - STI hx: Denies hx of STIs - Sexual preference: sexually active with 1 female partner - Contraceptive method: Nuvaring - Abdominal surgeries: c/s - GYN procedures: none   OB Hx:  OB History  Gravida Para Term Preterm AB Living  5 1 1  0 4 1  SAB IAB Ectopic Multiple Live Births  0 4 0 0 1    # Outcome Date GA Lbr Len/2nd Weight Sex  Type Anes PTL Lv  5 IAB 10/12/23     TAB     4 Term 12/11/22 [redacted]w[redacted]d  3785 g  CS-LTranv  N LIV  3 IAB 2018          2 IAB 2014          1 IAB 2011             PMH:  Past Medical History:  Diagnosis Date   BV (bacterial vaginosis)      PSH:  Past Surgical History:  Procedure Laterality Date   NO PAST SURGERIES       Meds:  Prior to Admission medications   Medication Sig Start Date End Date Taking? Authorizing Provider  propranolol ER (INDERAL LA) 60 MG 24 hr capsule TK 1 C PO QD IN THE EVE 12/17/18 09/27/19  [provider]     Allergies:  Allergies  Allergen Reactions   Oxycodone  Itching     FH:  Family History  Problem Relation Age of Onset   Hypertension Mother    Hypertension Father      SH:  Social History   Socioeconomic History   Marital status: Single    Spouse name: Not on file   Number of children: Not on file   Years of education: Not on file   Highest education level: Not on file  Occupational History   Not on file  Tobacco Use   Smoking status: Former    Current  packs/day: 0.00    Types: Cigarettes    Quit date: 10/13/2013    Years since quitting: 10.1   Smokeless tobacco: Never  Vaping Use   Vaping status: Former   Substances: Nicotine, Flavoring  Substance and Sexual Activity   Alcohol use: Not Currently    Comment: last use one week ago   Drug use: No   Sexual activity: Yes    Birth control/protection: Pill    Comment: stopped ocp 01/2021    Physical Exam: Vitals:   12/21/23 0450 12/21/23 0639  BP: 130/84 125/70  Pulse: 74 82  Resp: 20 18  Temp: 98.6 F (37 C) 98.5 F (36.9 C)  SpO2: 100% 99%   Body mass index is 25.61 kg/m.   General: Alert, NAD HEENT: Mucus membranes moist.  CV: RRR Resp: CTAB, no increased work of breathing Abd: BS hypoactive, soft, nontender to palpation, no rebound or guarding Pelvic:  (chaperone- Occupational hygienist present) Normal external female genitalia without lesions;  Normal bartholins/skenes  glands, normal urethral meatus;  Vaginal mucosa pink and moist with normal rugae, 2 fox swabs of dark red blood and clot in the vault Cervix is normal without lesions, slightly dilated with clot at os Bimanual exam:     14 wk size uterus, normal shape and contour;      No adnexal masses or tenderness Skin: No rashes.  Ext: No peripheral edema. Psych: Appropriate affect.    Labs:  Recent Labs    12/20/23 2056 12/21/23 0050 12/21/23 0554  WBC 9.0  --  9.3  HGB 12.3   < > 9.6*  HCT 35.5*   < > 26.8*  PLT 318  --  182   < > = values in this interval not displayed.   Recent Labs    12/20/23 2058  NA 140  K 3.2*  CL 104  CO2 23  CREATININE 0.84  BUN 7  CALCIUM 9.3     Radiology: TRANSABDOMINAL AND TRANSVAGINAL ULTRASOUND OF PELVIS- 12/21/23   DOPPLER ULTRASOUND OF OVARIES   TECHNIQUE: Both transabdominal and transvaginal ultrasound examinations of the pelvis were performed. Transabdominal technique was performed for global imaging of the pelvis including uterus, ovaries, adnexal regions, and pelvic cul-de-sac.   Color and duplex Doppler ultrasound was utilized to evaluate blood flow to the ovaries.   COMPARISON:  ultrasound 11/05/2016   FINDINGS: Uterus   Measurements: 11.5 x 6.5 x 7.2 cm = volume: 280 mL. 7.8 x 5.4 x 6.0 cm heterogenous mass in the fundus. This is favored to represent a large fibroid. This was not seen on pelvic ultrasound 11/05/2016.   Endometrium   Obscured by the mass.   Right ovary   Measurements: 2.9 x 1.5 x 1.8 cm = volume: 4.2 mL. Normal appearance/no adnexal mass.   Left ovary   Measurements: 3.9 x 2.5 x 2.6 cm = volume: 12.8 mL. Normal appearance/no adnexal mass.   Pulsed Doppler evaluation of both ovaries demonstrates normal low-resistance arterial and venous waveforms.   Other findings   Small to moderate free fluid in the pelvis.   IMPRESSION: 7.8 cm heterogenous mass in the fundus, favored to represent a  large fibroid. This was not seen on pelvic ultrasound 11/05/2016. The mass obscures the endometrium. Consider further evaluation with sonohysterogram for confirmation prior to hysteroscopy. Endometrial sampling should also be considered if patient is at high risk for endometrial carcinoma. (Ref: Radiological Reasoning: Algorithmic Workup of Abnormal Vaginal Bleeding with Endovaginal Sonography and Sonohysterography. AJR 2008; 161:W96-04)  Assessment/Plan: Teretha Mcquitty is a 28 y.o. [phone] with a hx of migraines without aura, and HTN who is admitted for abnormal uterine bleeding and acute blood loss anemia due to a large fundal fibroid.     #Abnormal uterine bleeding #Acute blood loss anemia #Fibroid - Vitals wnl - Hb 12.3 (6/8, 2056) > 9.5 (6/9, 0050) > 1u pRBC > 9.6 (0603) - IV estrogen 25 mg q6h- started at 0440. Given bleeding has slowed significant, will transition to OCPs and plan to tater- 3 pills x 3 days > 2 pills x 2 days > 1 pill daily - PM H&H ordered - If patient is stable tomorrow, will discharge home for her to follow-up with her primary OBGYN to discuss long term management of fibroid.   PPX: SCDs  Dispo: likely discharge home tomorrow     Electronically signed: Vita Grip, MD, 12/21/2023 7:33 AM

## 2023-12-21 NOTE — Progress Notes (Signed)
 Delroy Fields CNM here to instruct Patient in Transfusion of PRBC. Pt. V/o and she has signed and I have Witnessed consent form.

## 2023-12-21 NOTE — ED Notes (Signed)
 Pt given warm wipes for peri care and sanitary napkins.

## 2023-12-21 NOTE — ED Notes (Signed)
 This RN called inpatient unit to give report. Was told the receiving RN would need to call me back. Number was given to Diplomatic Services operational officer.

## 2023-12-21 NOTE — Progress Notes (Signed)
 Subjective:AUB  s/p 1 unit blood and IV estrogen  Patient reports some cramping  Bleeding slowed .    Objective: I have reviewed patient's vital signs, intake and output, medications, and labs.  Results for orders placed or performed during the hospital encounter of 12/20/23 (from the past 24 hours)  CBC     Status: Abnormal   Collection Time: 12/20/23  8:56 PM  Result Value Ref Range   WBC 9.0 4.0 - 10.5 K/uL   RBC 3.80 (L) 3.87 - 5.11 MIL/uL   Hemoglobin 12.3 12.0 - 15.0 g/dL   HCT 16.1 (L) 09.6 - 04.5 %   MCV 93.4 80.0 - 100.0 fL   MCH 32.4 26.0 - 34.0 pg   MCHC 34.6 30.0 - 36.0 g/dL   RDW 40.9 81.1 - 91.4 %   Platelets 318 150 - 400 K/uL   nRBC 0.0 0.0 - 0.2 %  Type and screen [facility]     Status: None (Preliminary result)   Collection Time: 12/20/23  8:56 PM  Result Value Ref Range   ABO/RH(D) O POS    Antibody Screen NEG    Sample Expiration 12/23/2023,2359    Unit Number [id number]    Blood Component Type RED CELLS,LR    Unit division 00    Status of Unit ISSUED    Transfusion Status OK TO TRANSFUSE    Crossmatch Result      Compatible Performed at [facility] [address] [place], [place] [address]   Basic metabolic panel     Status: Abnormal   Collection Time: 12/20/23  8:58 PM  Result Value Ref Range   Sodium 140 135 - 145 mmol/L   Potassium 3.2 (L) 3.5 - 5.1 mmol/L   Chloride 104 98 - 111 mmol/L   CO2 23 22 - 32 mmol/L   Glucose, Bld 99 70 - 99 mg/dL   BUN 7 6 - 20 mg/dL   Creatinine, Ser 6.29 0.44 - 1.00 mg/dL   Calcium 9.3 8.9 - 52.8 mg/dL   GFR, Estimated >41 >32 mL/min   Anion gap 13 5 - 15  POC urine preg, ED     Status: Normal   Collection Time: 12/20/23  9:04 PM  Result Value Ref Range   Preg Test, Ur Negative Negative  Hemoglobin and hematocrit, blood     Status: Abnormal   Collection Time: 12/21/23 12:50 AM  Result Value Ref Range   Hemoglobin 9.5 (L) 12.0 - 15.0 g/dL   HCT 44.0 (L) 10.2 - 72.5 %   ABO/Rh     Status: None   Collection Time: 12/21/23 12:50 AM  Result Value Ref Range   ABO/RH(D)      O POS Performed at [facility], [address] [place], [place] [address]   Wet prep, genital     Status: None   Collection Time: 12/21/23  1:40 AM  Result Value Ref Range   Yeast Wet Prep HPF POC NONE SEEN NONE SEEN   Trich, Wet Prep NONE SEEN NONE SEEN   Clue Cells Wet Prep HPF POC NONE SEEN NONE SEEN   WBC, Wet Prep HPF POC <10 <10   Sperm NONE SEEN   Chlamydia/NGC rt PCR ([facility] only)     Status: None   Collection Time: 12/21/23  1:40 AM  Result Value Ref Range   Specimen source GC/Chlam ENDOCERVICAL    Chlamydia Tr NOT DETECTED NOT DETECTED   N gonorrhoeae NOT DETECTED NOT DETECTED  Prepare RBC (crossmatch)     Status: None   Collection Time: 12/21/23  2:47 AM  Result Value Ref Range   Order Confirmation      ORDER PROCESSED BY BLOOD BANK Performed at [facility], [address] [place], [place] [address]   CBC     Status: Abnormal   Collection Time: 12/21/23  5:54 AM  Result Value Ref Range   WBC 9.3 4.0 - 10.5 K/uL   RBC 2.92 (L) 3.87 - 5.11 MIL/uL   Hemoglobin 9.6 (L) 12.0 - 15.0 g/dL   HCT 96.2 (L) 95.2 - 84.1 %   MCV 91.8 80.0 - 100.0 fL   MCH 32.9 26.0 - 34.0 pg   MCHC 35.8 30.0 - 36.0 g/dL   RDW 32.4 40.1 - 02.7 %   Platelets 182 150 - 400 K/uL   nRBC 0.0 0.0 - 0.2 %  Hemoglobin and hematocrit, blood     Status: Abnormal   Collection Time: 12/21/23  4:06 PM  Result Value Ref Range   Hemoglobin 9.6 (L) 12.0 - 15.0 g/dL   HCT 25.3 (L) 66.4 - 40.3 %      Assessment/Plan: Menorrhagia secondary to uterine fibroid  Bleeding stable on Iv estrogen now on PO OCps Morphine prn 1 mg  q 2 hrs prn pain ( oxycodone  cause pruritus ) Zofran 4 mg po prn nausea  Anticipate d/c in am  LOS: 0 days    Carolynn Citrin, MD 12/21/2023, 5:27 PM

## 2023-12-21 NOTE — Progress Notes (Signed)
 One Peri Pad change since arrival to Unit and the weight of the QBL was 5cc. Pt. Was assited up to BR and she voided 200cc of urine with frank blood;no clots. Pt. Has a steady gait and she denies syncope with ambulation. HGB mi transfusion was 9.6; this was up from 0050 HGB of 9.5. Pt. Appears comfortable and in NAD.

## 2023-12-21 NOTE — H&P (Signed)
 Consult History and Physical   SERVICE: Gynecology   Patient Name: Candace Curtis Patient MRN:   [id number]  CC: heavy vaginal bleeding   HPI: Candace Curtis is a 28 y.o. [id number] with menorrhagia. She reported new onset heavy vaginal bleeding that started last night. She currently uses NuvaRing for control of menstrual cycle. She placed a new one on 12/15/2023 in attempt to skip menstrual cycle. Vaginal bleeding started and she's been soaking pads with clots since. Reports feeling dizzy after presenting to ED. Pulse was initially in 140's but decreased to the 90's. BP's initially 143/96 but then started to decrease to 90's/60's with positive orthostatic blood pressures.   Past Obstetrical History: OB History     Gravida  5   Para  1   Term  1   Preterm  0   AB  4   Living  1      SAB  0   IAB  4   Ectopic  0   Multiple  0   Live Births  1           Past Gynecologic History: Patient's last menstrual period was 12/19/2023 (exact date).  Menstrual cycles are usually monthly though she attempts to do continuous cycling with NuvaRing to avoid heavy, painful menstrual cycles.   Past Medical History: Past Medical History:  Diagnosis Date   BV (bacterial vaginosis)     Past Surgical History:   Past Surgical History:  Procedure Laterality Date   NO PAST SURGERIES      Family History:  family history includes Hypertension in her father and mother.  Social History:  Social History   Socioeconomic History   Marital status: Single    Spouse name: Not on file   Number of children: Not on file   Years of education: Not on file   Highest education level: Not on file  Occupational History   Not on file  Tobacco Use   Smoking status: Former    Current packs/day: 0.00    Types: Cigarettes    Quit date: 10/13/2013    Years since quitting: 10.1   Smokeless tobacco: Never  Vaping Use   Vaping status: Former   Substances: Nicotine, Flavoring  Substance and Sexual  Activity   Alcohol use: Not Currently    Comment: last use one week ago   Drug use: No   Sexual activity: Yes    Birth control/protection: Pill    Comment: stopped ocp 01/2021  Other Topics Concern   Not on file  Social History Narrative   Not on file   Social Drivers of Health   Financial Resource Strain: Not on file  Food Insecurity: Not on file  Transportation Needs: No Transportation Needs (12/11/2022)   Received from [facility], [facility]   PRAPARE - Transportation    In the past 12 months, has lack of transportation kept you from medical appointments or from getting medications?: No    Lack of Transportation (Non-Medical): No  Physical Activity: Not on file  Stress: Not on file  Social Connections: Not on file  Intimate Partner Violence: Not At Risk (04/08/2022)   Humiliation, Afraid, Rape, and Kick questionnaire    Fear of Current or Ex-Partner: No    Emotionally Abused: No    Physically Abused: No    Sexually Abused: No    Home Medications:  Medications reconciled in EPIC  No current facility-administered medications on file prior to encounter.   Current  Outpatient Medications on File Prior to Encounter  Medication Sig Dispense Refill   [DISCONTINUED] propranolol ER (INDERAL LA) 60 MG 24 hr capsule TK 1 C PO QD IN THE EVE      Allergies:  No Known Allergies  Physical Exam:  Temp:  [98.5 F (36.9 C)-99.1 F (37.3 C)] 98.5 F (36.9 C) (06/09 0410) Pulse Rate:  [72-130] 72 (06/09 0410) Resp:  [16-20] 18 (06/09 0410) BP: (97-143)/(61-98) 128/88 (06/09 0410) SpO2:  [96 %-99 %] 96 % (06/09 0410) Weight:  [63.5 kg] 63.5 kg (06/08 2053)  Physical Exam   Labs/Studies:   CBC and Coags:  Lab Results  Component Value Date   WBC 9.0 12/20/2023   NEUTOPHILPCT 63 11/04/2016   EOSPCT 1 11/04/2016   BASOPCT 0 11/04/2016   LYMPHOPCT 28 11/04/2016   HGB 9.5 (L) 12/21/2023   HCT 27.5 (L) 12/21/2023   MCV 93.4 12/20/2023    PLT 318 12/20/2023   CMP:  Lab Results  Component Value Date   NA 140 12/20/2023   K 3.2 (L) 12/20/2023   CL 104 12/20/2023   CO2 23 12/20/2023   BUN 7 12/20/2023   CREATININE 0.84 12/20/2023   CREATININE 0.63 11/04/2016    TVUS:   US  PELVIC COMPLETE W TRANSVAGINAL AND TORSION R/O Result Date: 12/21/2023 CLINICAL DATA:  Vaginal bleeding and clots EXAM: TRANSABDOMINAL AND TRANSVAGINAL ULTRASOUND OF PELVIS DOPPLER ULTRASOUND OF OVARIES TECHNIQUE: Both transabdominal and transvaginal ultrasound examinations of the pelvis were performed. Transabdominal technique was performed for global imaging of the pelvis including uterus, ovaries, adnexal regions, and pelvic cul-de-sac. Color and duplex Doppler ultrasound was utilized to evaluate blood flow to the ovaries. COMPARISON:  ultrasound 11/05/2016 FINDINGS: Uterus Measurements: 11.5 x 6.5 x 7.2 cm = volume: 280 mL. 7.8 x 5.4 x 6.0 cm heterogenous mass in the fundus. This is favored to represent a large fibroid. This was not seen on pelvic ultrasound 11/05/2016. Endometrium Obscured by the mass. Right ovary Measurements: 2.9 x 1.5 x 1.8 cm = volume: 4.2 mL. Normal appearance/no adnexal mass. Left ovary Measurements: 3.9 x 2.5 x 2.6 cm = volume: 12.8 mL. Normal appearance/no adnexal mass. Pulsed Doppler evaluation of both ovaries demonstrates normal low-resistance arterial and venous waveforms. Other findings Small to moderate free fluid in the pelvis. IMPRESSION: 7.8 cm heterogenous mass in the fundus, favored to represent a large fibroid. This was not seen on pelvic ultrasound 11/05/2016. The mass obscures the endometrium. Consider further evaluation with sonohysterogram for confirmation prior to hysteroscopy. Endometrial sampling should also be considered if patient is at high risk for endometrial carcinoma. (Ref: Radiological Reasoning: Algorithmic Workup of Abnormal Vaginal Bleeding with Endovaginal Sonography and Sonohysterography. AJR 2008; 161:W96-04)  Electronically Signed   By: Rozell Cornet M.D.   On: 12/21/2023 00:42     Assessment / Plan:   Candace Curtis is a 28 y.o. [id number] who presents with menorrhagia and fibroid uterus.   Observation to Women's Unit  -Admission status: observation -Reason for admission: menorrhagia with symptomatic blood loss -Dr. Baker Bon. Schermerhorn notified of observation, reviewed plan of care   Menorrhagia -IV Estrogen ordered q 6 hours for 24 hours  -1 unit pRBC given d/t symptomatic acute blood loss  -Pad count  -Repeat CBC ordered for 6 hours   Fibroid uterus -Large fibroid seen on US  in fundus obscuring endometrium  -Candace Curtis denies history of known uterine fibroid  -Will discuss management options with Dr. Baker Bon. Schermerhorn.  -She does desire future pregnancies so will favor fertility  sparing management options   ----- Fraser Jackson, CNM Midwife [facility], Department of OB/GYN [facility]

## 2023-12-21 NOTE — Progress Notes (Signed)
 Called to room by Patient and she states ,'I forgot that Oxycodone  makes me itch. Can I have something for itching? " I stopped Pt.'s Blood Transfusion that has appx 25cc left in the bag. I called A. Mackie CNM and she ordered Benadryl  25mg  PO and she states she does not think this is a blood transfusion reaction and that we do not need to do the Transfusion reaction protocol.

## 2023-12-21 NOTE — Plan of Care (Signed)
 Pt. Admitted to Room 351. Alert and oriented with quiet affect. Color good, skin w&d. Positive pedal pulses equal and strong with cap. Refill < 3 seconds. Pt. Oriented to room, Safety and Security, Fall Prevention and POC. Instructed to call when she needs to get UOB to prevent Falls. Pt. V/O. Education initiated.

## 2023-12-22 DIAGNOSIS — E876 Hypokalemia: Secondary | ICD-10-CM | POA: Diagnosis present

## 2023-12-22 DIAGNOSIS — D219 Benign neoplasm of connective and other soft tissue, unspecified: Secondary | ICD-10-CM | POA: Diagnosis present

## 2023-12-22 LAB — BASIC METABOLIC PANEL WITH GFR
Anion gap: 7 (ref 5–15)
BUN: 6 mg/dL (ref 6–20)
CO2: 24 mmol/L (ref 22–32)
Calcium: 7.8 mg/dL — ABNORMAL LOW (ref 8.9–10.3)
Chloride: 105 mmol/L (ref 98–111)
Creatinine, Ser: 0.64 mg/dL (ref 0.44–1.00)
GFR, Estimated: >60 mL/min (ref >60)
Glucose, Bld: 90 mg/dL (ref 70–99)
Potassium: 3 mmol/L — ABNORMAL LOW (ref 3.5–5.1)
Sodium: 136 mmol/L (ref 135–145)

## 2023-12-22 LAB — TYPE AND SCREEN
ABO/RH(D): O POS
Antibody Screen: NEGATIVE
Unit division: 0

## 2023-12-22 LAB — CBC
HCT: 27.9 % — ABNORMAL LOW (ref 36.0–46.0)
Hemoglobin: 9.8 g/dL — ABNORMAL LOW (ref 12.0–15.0)
MCH: 32.6 pg (ref 26.0–34.0)
MCHC: 35.1 g/dL (ref 30.0–36.0)
MCV: 92.7 fL (ref 80.0–100.0)
Platelets: 183 10*3/uL (ref 150–400)
RBC: 3.01 MIL/uL — ABNORMAL LOW (ref 3.87–5.11)
RDW: 13.5 % (ref 11.5–15.5)
WBC: 7.3 10*3/uL (ref 4.0–10.5)
nRBC: 0 % (ref 0.0–0.2)

## 2023-12-22 LAB — BPAM RBC
Blood Product Expiration Date: 202507062359
ISSUE DATE / TIME: 202506090425
Unit Type and Rh: 5100

## 2023-12-22 MED ORDER — ONDANSETRON 4 MG PO TBDP: 4.0000 mg | ORAL_TABLET | Freq: Four times a day (QID) | ORAL | 0 refills | Status: AC | PRN

## 2023-12-22 MED ORDER — NORGESTIMATE-ETH ESTRADIOL 0.25-35 MG-MCG PO TABS: ORAL_TABLET | ORAL | 0 refills | Status: DC

## 2023-12-22 MED ORDER — POTASSIUM CHLORIDE CRYS ER 20 MEQ PO TBCR: 20.0000 meq | EXTENDED_RELEASE_TABLET | Freq: Every day | ORAL | 0 refills | Status: DC

## 2023-12-22 MED ORDER — FERROUS SULFATE 325 (65 FE) MG PO TABS: 325.0000 mg | ORAL_TABLET | ORAL | 0 refills | Status: AC

## 2023-12-22 MED ORDER — FERROUS SULFATE 325 (65 FE) MG PO TABS: 325.0000 mg | ORAL_TABLET | ORAL | Status: DC

## 2023-12-22 MED ORDER — POTASSIUM CHLORIDE CRYS ER 20 MEQ PO TBCR
40.0000 meq | EXTENDED_RELEASE_TABLET | Freq: Once | ORAL | Status: AC
  Administered 2023-12-22: 40 meq via ORAL
  Filled 2023-12-22: qty 2

## 2023-12-22 MED ORDER — ACETAMINOPHEN 500 MG PO TABS: 1000.0000 mg | ORAL_TABLET | Freq: Three times a day (TID) | ORAL | Status: AC | PRN

## 2023-12-22 MED ORDER — IBUPROFEN 800 MG PO TABS: 800.0000 mg | ORAL_TABLET | Freq: Three times a day (TID) | ORAL | 0 refills | Status: AC | PRN

## 2023-12-22 NOTE — Discharge Summary (Signed)
 GYN Discharge Summary    Patient Name: Candace Curtis   DOB:1995-11-08 MEDICAL RECORD [id number]   Date of Admission: 12/20/2023 Date of Discharge:  12/22/2023   Admitting Diagnosis:  Abnormal uterine bleeding (AUB) [N93.9] Symptomatic anemia [D64.9] Uterine leiomyoma, unspecified location [D25.9] Hypokalemia Discharge Diagnosis: Same   Condition on Discharge: Good    Brief Hospital Course:  Candace Curtis is a 28 y.o. [phone] with a hx of migraines without aura, and HTN who presented to the hospital with heavy vaginal bleeding and found to have a 8 cm fundal fibroid obscuring the endometrium. Vitals initially notable in the ED for tachycardia initially to 130 and hypertensive to 140s/90s. She received 2 doses of IV estrogen 25 mg which slowed her bleeding significantly and she was transitioned to a taper of oral contraceptive pills on 6/9- 3 pills x3d (6/9-6/11), 2 pills x 2d (6/12-6/13), and 1 pill daily 96/14-). Her hemoglobin trend was as follows: 12.3 (6/8, 2056) > 9.5 (6/9, 0050) > 1u pRBC > 9.6 (6/9, 0603)> 9.8 (6/10). On day of discharge her bleeding had essentially stopped. She was discharged home on PO iron MWF. She plans to follow up with her outpatient gynecologist to discuss management options for her fibroid.   #HTN: Her blood pressure was intermittently elevated up to 140s/90s. She was encouraged to follow up with her PCP  #Hypokalemia: K 3.2 (6/8) > 3 (6/10 > PO KCl - Discharged on PO Kcl 20 mEq x 2 days - Needs BMP at follow-up   Discharge Medications:  Allergies as of 12/22/2023       Reactions   Oxycodone  Itching        Medication List     TAKE these medications    acetaminophen  500 MG tablet Commonly known as: TYLENOL  Take 2 tablets (1,000 mg total) by mouth every 8 (eight) hours as needed for mild pain (pain score 1-3), headache or fever.   ferrous sulfate 325 (65 FE) MG tablet Take 1 tablet (325 mg total) by mouth every Monday, Wednesday, and Friday. Start  taking on: December 23, 2023   ibuprofen  800 MG tablet Commonly known as: ADVIL  Take 1 tablet (800 mg total) by mouth every 8 (eight) hours as needed for up to 7 days. Take every 8 hours for 72 hours, and then as needed   norgestimate-ethinyl estradiol 0.25-35 MG-MCG tablet Commonly known as: Sprintec 28 Take 3 tablets for 1 day, 2 tablets for 2 days, and then 1 tablet daily   ondansetron 4 MG disintegrating tablet Commonly known as: ZOFRAN-ODT Take 1 tablet (4 mg total) by mouth every 6 (six) hours as needed for nausea or vomiting.   potassium chloride SA 20 MEQ tablet Commonly known as: KLOR-CON M Take 1 tablet (20 mEq total) by mouth daily for 2 days.          Discharge Activities:  Activity as tolerated   Discharge Diet:  Regular diet    Discharge Instructions:  Take all medications as directed and keep all follow-up appointments. Do not smoke, drink alcohol, or use drugs. Seek care immediately should you experience fever, inability to keep down food or liquids, increasing pain, vaginal bleeding.    Discharge Follow-Up:  Follow up with outpatient GYN within one week   Candace Hoard, MD, 12/22/2023

## 2023-12-22 NOTE — Progress Notes (Addendum)
 GYN Progress Note   Name: Omara Alcon MRN: [id number] Date of service: 12/22/2023    Subjective   Reports bleeding has essentially stopped. Reports continued cramping. Eating normally without nausea/ vomiting. BM last night. No concerns. Has follow up with GYN this afternoon.    Objective   Temp:  [98.6 F (37 C)-99.2 F (37.3 C)] 99.1 F (37.3 C) (06/10 0820) Pulse Rate:  [65-77] 65 (06/10 0820) Resp:  [16-20] 18 (06/10 0820) BP: (95-140)/(62-95) 140/95 (06/10 0820) SpO2:  [98 %-100 %] 99 % (06/10 0820)  Intake/Output Summary (Last 24 hours) at 12/22/2023 0829 Last data filed at 12/22/2023 0800 Gross per 24 hour  Intake --  Output 1899 ml  Net -1899 ml     Gen: Alert, no acute distress CV: RRR Resp: CTAB on anterior lung fields, no increased work of breathing Abd: BS present, soft, minimally tender Ext: No peripheral edema.      Labs: Recent Labs    12/21/23 0554 12/21/23 1606 12/22/23 0540  WBC 9.3  --  7.3  HGB 9.6*   < > 9.8*  HCT 26.8*   < > 27.9*  MCV 91.8  --  92.7  PLT 182  --  183   < > = values in this interval not displayed.   Recent Labs    12/20/23 2058 12/22/23 0540  NA 140 136  K 3.2* 3.0*  CL 104 105  CO2 23 24  CREATININE 0.84 0.64  BUN 7 6  CALCIUM 9.3 7.8*   No results found for: "MG"     Assessment & Plan   Gwyndolyn Cambridge is a 28 y.o. [phone] with a hx of migraines without aura, and HTN who is admitted for abnormal uterine bleeding and acute blood loss anemia due to a large fundal fibroid.      #Abnormal uterine bleeding #Acute blood loss anemia #Fibroid - Vitals wnl other than intermittently hypertensive up to 140s/90 - Hb 12.3 (6/8, 2056) > 9.5 (6/9, 0050) > 1u pRBC > 9.6 (0603) > 9.6 (6/9, 1654) > 9.8 this AM - IV estrogen 25 mg x2 doses (6/9) > OCPs taper- 3 pills x 3 days(6/9-6/11) > 2 pills x 2 days (6/12-6/13)> 1 pill daily (6/14) - PO ferrous sulfate 325 mg MWF - PO ibuprofen  and tylenol  PRN for pain   #HTN - Needs  to follow-up outpatient with PCP regarding her BP  #Hypokalemia: K 3.2 (6/8) > 3 (6/10) > PO Kcl 40 mEq - Discharged on PO Kcl 20 mEq x 2 days - Needs BMP at follow-up  PPX: SCDs   Dispo: discharge home   Electronically signed: Vita Grip, MD, 12/22/2023 8:29 AM

## 2023-12-22 NOTE — Progress Notes (Signed)
 Patient discharged home with family.  Patient will be escorted out by auxiliary.   Erlene Hawks, RN 12/22/23 @1037 

## 2023-12-22 NOTE — Plan of Care (Signed)
 Discharge instructions, when to follow up, and prescriptions reviewed with patient.  Patient verbalized understanding. Erlene Hawks, RN 12/22/23 @1029 

## 2023-12-22 NOTE — Discharge Instructions (Addendum)
  When to Contact Your Doctor: Foul-smelling vaginal discharge.  Severe pelvic pain that lasts longer than a few hours.  Signs of infection (fever, chills, increased pain).  Heavy bleeding that requires soaking multiple pads in an hour.   Follow-up Care: Keep your follow-up appointment with your healthcare provider as instructed. - Your blood pressure was high during your admission. Talk to your primary doctor about your blood pressure.

## 2023-12-29 NOTE — H&P (View-Only) (Signed)
 [facility] Evaluation   Chief Complaint:  Chief Complaint  Patient presents with  . Vaginal Bleeding    Still bleeding, passed another clot yesterday- unable to visualize; continues to have HA   History of Present Illness:   Candace Curtis is a 28 y.o. seen for the above chief complaint. PMH is notable for for recent medical abortion. Pt is still bleeding and passing clots. Patient has been having a headache but reports no pain related to bleeding today. Yesterday she experienced intense cramping before passing the a large clot. Pt also wanted to mention that aunts and cousins on her Dad's side have factor V Leiden.   Underwent MRI which just showed complex blood products inside the uterine cavity.  NO uterine fibroid  Review of Systems: Pertinent positive/negative documented in HPI and all other systems reviewed and negative.   Past Medical History:  Past Medical History:  Diagnosis Date  . Anemia     Past Surgical History:  Past Surgical History:  Procedure Laterality Date  . CESAREAN DELIVERY Bilateral 12/10/2022   Procedure: CESAREAN DELIVERY ONLY; INCLUDING POSTPARTUM CARE;  Surgeon: Chari Shields, MD;  Location: [facility];  Service: Obstetrics;  Laterality: Bilateral;   Medications:   Current Outpatient Medications:  .  doxycycline  (MONODOX ) 100 MG capsule, Take 1 capsule (100 mg total) by mouth 2 (two) times daily for 14 days, Disp: 28 capsule, Rfl: 0 .  ferrous sulfate  325 (65 FE) MG EC tablet, Take 325 mg by mouth daily with breakfast, Disp: , Rfl:  .  tranexamic acid  (LYSTEDA ) 650 mg tablet, Take 2 tablets (1,300 mg total) by mouth 3 (three) times daily Take for a maximum of 5 days during monthly menstruation., Disp: 30 tablet, Rfl: 1 .  PRENATAL 28 mg iron- 800 mcg, Take 1 tablet by mouth once daily (Patient not taking: Reported on 12/29/2023), Disp: , Rfl:   Social History:  Social History   Socioeconomic History  . Marital status: Single  Tobacco Use  .  Smoking status: Former  . Smokeless tobacco: Former  Advertising account planner  . Vaping status: Every Day  Substance and Sexual Activity  . Alcohol use: Not Currently  . Sexual activity: Yes    Partners: Male    Birth control/protection: None   Social Drivers of Health   Food Insecurity: No Food Insecurity (12/21/2023)   Received [facility]   Hunger Vital Sign   . Within the past 12 months, you worried that your food would run out before you got the money to buy more.: Never true   . Within the past 12 months, the food you bought just didn't last and you didn't have money to get more.: Never true  Transportation Needs: No Transportation Needs (12/21/2023)   Received from [facility] - Transportation   . Lack of Transportation (Medical): No   . Lack of Transportation (Non-Medical): No    Family History:  Family History  Problem Relation Name Age of Onset  . High blood pressure (Hypertension) Mother    . High blood pressure (Hypertension) Father      Physical Exam:  Vitals:   12/29/23 1023  BP: (!) 137/98  Pulse: 82   General appearance: Well appearing and well-developed in no acute distress.  Cardiovascular: Regular rate  Respiratory:  Normal respiratory effort  Assessment/Plan: Patient had a medical abortion in March, one normal period following month, and then persistent bleeding since then. Patient has passed several large clots  and is bleeding heavily, likely due to retained tissue and blood products in the uterus. Given the ultrasound findings and MRI - suction D&C recommended.  Request sent to surgical scheduler to find space urgently for her.  ED precautions reviewed  Student Documentation Attestation: I saw [employer] in the presence of supervising attending physician, CLAYTON ALDON ALFONSO .  MIA OLSON, PA Student   Supervisor Attestation: Investment banker, operational:   I was present with the student and patient during the History and Physical Exam. I verify the  findings documented, personally re-performed a Physical Exam, and agree with the Medical Decision Making (Assessment and Plan), with changes to the note by me (ATTSTTP).  CLAYTON JERILYNN ANDERSON, MD

## 2023-12-30 NOTE — Procedures (Signed)
 Operative Note   SURGERY DATE: 12/30/2023  PRE-OP DIAGNOSIS: Retained products of conception, early pregnancy (HHS-HCC) [O02.1]    POST-OP DIAGNOSIS: Post-Op Diagnosis Codes:    * Retained products of conception, early pregnancy (HHS-HCC) [O02.1]   Procedure(s): TREATMENT OF MISSED ABORTION, COMPLETED SURGICALLY; FIRST TRIMESTER  SURGEON: Surgeons and Role:    * Clifford, Susann Levy, MD - Primary  ASSISTANT(S): none  STAFF: Circulator: Christel Georgia, RN Scrub Person: Lennie Quant C  ANESTHESIA:monitored anesthesia care  INDICATION(S): persistent bleeding post medical abortion with complex appearing clot/tissue on imaging study  OPERATIVE FINDINGS: uterus upper limits normal size at time of exam under anesthesia, normal adnexa; scant blood in vault.  Moderate amount of tissue/clot noted at time of curettage  OPERATIVE REPORT:  After informed consent obtained, patient taken to OR and given MAC sedation. Placed in lithotomy, perineum/vagina prepped and draped in sterile fashion. EUA performed with above findings noted.  Speculum placed and paracervical block obtained with 1% lidocaine/epinephrine.  Anterior lip of cervix grasped with single tooth tenaculum. Cervix dilated to #21 pratt. #7 suction curette easily advanced to the fundus and suction device turned on.  Three passes were made with the suction curette with moderate amount of tissue obtained on first pass, less on second and none on third.   Suction curette was removed without incident, no bleeding noted post procedure. Tenaculum removed, silver nitrate placed at tenaculum site. Patient taken to PACU in stable condition.    ESTIMATED BLOOD LOSS: less than 50 mL   * No values recorded between 12/30/2023  8:51 AM and 12/30/2023  8:57 AM *      TOTAL IV FLUIDS: Crystalloid 400 ml  SPECIMENS:  ID Type Source Tests Collected by Time Destination  1 : ENDOMETRIAL CONTENTS Tissue-Pathology Tissue PATHOLOGY - GENERAL / OTHER  Curly Woodrow Lipps, MD 12/30/2023 [phone]      IMPLANTS:  * No implants in log *   COMPLICATIONS: none  DISPOSITION: PACU - hemodynamically stable.  ATTESTATION:  TP- Surgery (W/O Resident) - I performed the entire procedure w/o resident involvement (TP).

## 2024-01-26 ENCOUNTER — Encounter: Payer: Self-pay | Admitting: Emergency Medicine

## 2024-01-26 ENCOUNTER — Ambulatory Visit
Admission: EM | Admit: 2024-01-26 | Discharge: 2024-01-26 | Disposition: A | Discharge status: Home / Self Care | Attending: Emergency Medicine | Admitting: Emergency Medicine

## 2024-01-26 DIAGNOSIS — M542 Cervicalgia: Secondary | ICD-10-CM | POA: Diagnosis not present

## 2024-01-26 DIAGNOSIS — M62838 Other muscle spasm: Secondary | ICD-10-CM

## 2024-01-26 DIAGNOSIS — M436 Torticollis: Secondary | ICD-10-CM | POA: Diagnosis not present

## 2024-01-26 MED ORDER — CYCLOBENZAPRINE HCL 5 MG PO TABS: 5.0000 mg | ORAL_TABLET | Freq: Three times a day (TID) | ORAL | 0 refills | Status: AC | PRN

## 2024-01-26 NOTE — ED Triage Notes (Signed)
 Pt presents with right side neck pain that radiates into her arm x 5 days. Pt denies any injury. Pt has taking OTC pain medication and applied heat to the area.

## 2024-01-26 NOTE — ED Provider Notes (Signed)
 [facility]    CSN: [id number] Arrival date & time: 01/26/24  0815      History   Chief Complaint Chief Complaint  Patient presents with   Neck Pain    HPI Candace Curtis is a 28 y.o. female.   28 year old female, Candace Curtis, presents to urgent care for evaluation of right sided neck pain that radiates into right arm x 5 days. Pt denies injury, works as Child psychotherapist,  lots of typing and phone use, using OTC meds(tylenol ,ibuprofen ,biofreeze) and heat for pain management with minimal results.  PMH: D&C 12/30/2023, hypokalemia, fibroids, AUB, anemia, hypertension  The history is provided by the patient. No language interpreter was used.    Past Medical History:  Diagnosis Date   BV (bacterial vaginosis)     Patient Active Problem List   Diagnosis Date Noted   Torticollis, acute 01/26/2024   Muscle spasms of neck 01/26/2024   Neck pain 01/26/2024   Hypokalemia 12/22/2023   Fibroid 12/22/2023   Abnormal uterine bleeding (AUB) 12/21/2023   Acute blood loss anemia 12/21/2023   Essential hypertension 12/21/2023    Past Surgical History:  Procedure Laterality Date   NO PAST SURGERIES      OB History     Gravida  5   Para  1   Term  1   Preterm  0   AB  4   Living  1      SAB  0   IAB  4   Ectopic  0   Multiple  0   Live Births  1            Home Medications    Prior to Admission medications   Medication Sig Start Date End Date Taking? Authorizing Provider  cyclobenzaprine  (FLEXERIL ) 5 MG tablet Take 1 tablet (5 mg total) by mouth 3 (three) times daily as needed for up to 5 days for muscle spasms. 01/26/24 01/31/24 Yes Yuki Brunsman, Rilla, NP  ferrous sulfate  325 (65 FE) MG tablet Take 1 tablet (325 mg total) by mouth every Monday, Wednesday, and Friday. 12/23/23 03/22/24 Yes Schermerhorn, Demetra V, MD  acetaminophen  (TYLENOL ) 500 MG tablet Take 2 tablets (1,000 mg total) by mouth every 8 (eight) hours as needed for mild pain (pain  score 1-3), headache or fever. 12/22/23   Schermerhorn, Beverli GAILS, MD  norgestimate -ethinyl estradiol  (SPRINTEC 28) 0.25-35 MG-MCG tablet Take 3 tablets for 1 day, 2 tablets for 2 days, and then 1 tablet daily 12/22/23   Schermerhorn, Demetra V, MD  ondansetron  (ZOFRAN -ODT) 4 MG disintegrating tablet Take 1 tablet (4 mg total) by mouth every 6 (six) hours as needed for nausea or vomiting. 12/22/23   Schermerhorn, Beverli GAILS, MD  propranolol ER (INDERAL LA) 60 MG 24 hr capsule TK 1 C PO QD IN THE EVE 12/17/18 09/27/19  [provider]    Family History Family History  Problem Relation Age of Onset   Hypertension Mother    Hypertension Father     Social History Social History   Tobacco Use   Smoking status: Former    Current packs/day: 0.00    Types: Cigarettes    Quit date: 10/13/2013    Years since quitting: 10.2   Smokeless tobacco: Never  Vaping Use   Vaping status: Every Day   Substances: Nicotine, Flavoring  Substance Use Topics   Alcohol use: Yes    Comment: last use one week ago   Drug use: No  Allergies   Oxycodone    Review of Systems Review of Systems  Constitutional:  Negative for fever.  Musculoskeletal:  Positive for myalgias, neck pain and neck stiffness.  Skin: Negative.   All other systems reviewed and are negative.    Physical Exam Triage Vital Signs ED Triage Vitals  Encounter Vitals Group     BP      Girls Systolic BP Percentile      Girls Diastolic BP Percentile      Boys Systolic BP Percentile      Boys Diastolic BP Percentile      Pulse      Resp      Temp      Temp src      SpO2      Weight      Height      Head Circumference      Peak Flow      Pain Score      Pain Loc      Pain Education      Exclude from Growth Chart    No data found.  Updated Vital Signs BP (!) 149/83 (BP Location: Left Arm)   Pulse 87   Temp 98.9 F (37.2 C) (Oral)   Resp 18   LMP 12/20/2023   SpO2 100%   Visual Acuity Right Eye Distance:    Left Eye Distance:   Bilateral Distance:    Right Eye Near:   Left Eye Near:    Bilateral Near:     Physical Exam Vitals and nursing note reviewed.  Neck:     Trachea: Trachea normal.     Meningeal: Brudzinski's sign and Kernig's sign absent.      Comments: Area of neck tenderness marked, no rash, no stepoffs Musculoskeletal:     Cervical back: Torticollis present. No signs of trauma. Pain with movement and muscular tenderness present. No spinous process tenderness.  Skin:    General: Skin is warm.  Neurological:     General: No focal deficit present.     Mental Status: She is alert and oriented to person, place, and time.     GCS: GCS eye subscore is 4. GCS verbal subscore is 5. GCS motor subscore is 6.     Sensory: Sensation is intact.     Motor: Motor function is intact.     Coordination: Coordination is intact.     Gait: Gait is intact.     Comments: MAEW x 4, handgrips =bilaterally, radial +2 bilaterally      UC Treatments / Results  Labs (all labs ordered are listed, but only abnormal results are displayed) Labs Reviewed - No data to display  EKG   Radiology No results found.  Procedures Procedures (including critical care time)  Medications Ordered in UC Medications - No data to display  Initial Impression / Assessment and Plan / UC Course  I have reviewed the triage vital signs and the nursing notes.  Pertinent labs & imaging results that were available during my care of the patient were reviewed by me and considered in my medical decision making (see chart for details).    Discussed exam findings and plan of care with patient, will prescription Flexeril  for muscle spasm, may continue use over-the-counter meds for symptom management, may want to try lidocaine gel or patch for pain as well.  Follow-up with PCP in 3 days if no improvement sooner if worse, strict go to ER precautions given.  Patient verbalized understanding to this  provider  Ddx:  Torticollis, cervical strain, muscle spasm, cervical radiculopathy Final Clinical Impressions(s) / UC Diagnoses   Final diagnoses:  Torticollis, acute  Muscle spasms of neck  Neck pain     Discharge Instructions      Try heat or ice to affected area 3 times a day for 20 minutes , May use over-the-counter meds for symptom management, if you have loss of function or loss of strength or worsening symptoms go to the emergency room for further evaluation Follow-up with your PCP in 3 days if symptoms do not improve-call for appointment     ED Prescriptions     Medication Sig Dispense Auth. Provider   cyclobenzaprine  (FLEXERIL ) 5 MG tablet Take 1 tablet (5 mg total) by mouth 3 (three) times daily as needed for up to 5 days for muscle spasms. 15 tablet Shanequia Kendrick, Rilla, NP      PDMP not reviewed this encounter.   Aminta Rilla, NP 01/26/24 [phone]

## 2024-01-26 NOTE — Discharge Instructions (Addendum)
 Try heat or ice to affected area 3 times a day for 20 minutes , May use over-the-counter meds for symptom management, if you have loss of function or loss of strength or worsening symptoms go to the emergency room for further evaluation Follow-up with your PCP in 3 days if symptoms do not improve-call for appointment

## 2024-06-23 ENCOUNTER — Emergency Department

## 2024-06-23 ENCOUNTER — Other Ambulatory Visit: Payer: Self-pay

## 2024-06-23 ENCOUNTER — Emergency Department
Admission: EM | Admit: 2024-06-23 | Discharge: 2024-06-23 | Disposition: A | Discharge status: Home / Self Care | Attending: Emergency Medicine | Admitting: Emergency Medicine

## 2024-06-23 DIAGNOSIS — N939 Abnormal uterine and vaginal bleeding, unspecified: Secondary | ICD-10-CM | POA: Diagnosis present

## 2024-06-23 DIAGNOSIS — R319 Hematuria, unspecified: Secondary | ICD-10-CM | POA: Insufficient documentation

## 2024-06-23 LAB — URINALYSIS, ROUTINE W REFLEX MICROSCOPIC
Bilirubin Urine: NEGATIVE
Glucose, UA: NEGATIVE mg/dL
Ketones, ur: 20 mg/dL — AB
Leukocytes,Ua: NEGATIVE
Nitrite: NEGATIVE
Protein, ur: 100 mg/dL — AB
Specific Gravity, Urine: 1.017 (ref 1.005–1.030)
pH: 5 (ref 5.0–8.0)

## 2024-06-23 LAB — CBC WITH DIFFERENTIAL/PLATELET
Abs Immature Granulocytes: 0 K/uL (ref 0.00–0.07)
Basophils Absolute: 0 K/uL (ref 0.0–0.1)
Basophils Relative: 1 %
Eosinophils Absolute: 0.1 K/uL (ref 0.0–0.5)
Eosinophils Relative: 3 %
HCT: 38.5 % (ref 36.0–46.0)
Hemoglobin: 12.9 g/dL (ref 12.0–15.0)
Immature Granulocytes: 0 %
Lymphocytes Relative: 33 %
Lymphs Abs: 1.4 K/uL (ref 0.7–4.0)
MCH: 33.9 pg (ref 26.0–34.0)
MCHC: 33.5 g/dL (ref 30.0–36.0)
MCV: 101 fL — ABNORMAL HIGH (ref 80.0–100.0)
Monocytes Absolute: 1 K/uL (ref 0.1–1.0)
Monocytes Relative: 23 %
Neutro Abs: 1.7 K/uL (ref 1.7–7.7)
Neutrophils Relative %: 40 %
Platelets: 313 K/uL (ref 150–400)
RBC: 3.81 MIL/uL — ABNORMAL LOW (ref 3.87–5.11)
RDW: 13 % (ref 11.5–15.5)
WBC: 4.1 K/uL (ref 4.0–10.5)
nRBC: 0 % (ref 0.0–0.2)

## 2024-06-23 LAB — TYPE AND SCREEN
ABO/RH(D): O POS
Antibody Screen: NEGATIVE

## 2024-06-23 LAB — POC URINE PREG, ED: Preg Test, Ur: NEGATIVE

## 2024-06-23 LAB — PREGNANCY, URINE: Preg Test, Ur: NEGATIVE

## 2024-06-23 MED ORDER — MEDROXYPROGESTERONE ACETATE 10 MG PO TABS: 10.0000 mg | ORAL_TABLET | Freq: Every day | ORAL | 0 refills | Status: AC

## 2024-06-23 MED ORDER — CEPHALEXIN 500 MG PO CAPS: 500.0000 mg | ORAL_CAPSULE | Freq: Three times a day (TID) | ORAL | 0 refills | Status: AC

## 2024-06-23 NOTE — ED Triage Notes (Addendum)
 Pt arrives via POV with c/o vaginal bleeding and fatigue. Pt states that their period started on 11/10 and has been some sort of bleeding since then. She's had several weeks of heavy bleeding and has been fatigued since then. Pt is A&Ox4 and ambulatory during triage.   Pt endorses a constant HA for the last 3-4 days.

## 2024-06-23 NOTE — Discharge Instructions (Signed)
 Follow-up with your regular doctor.  Return if worsening.  Take medications as prescribed

## 2024-06-23 NOTE — ED Notes (Signed)
 Patient states today she only noticed blood when she wiped after voiding.

## 2024-06-23 NOTE — ED Provider Notes (Signed)
 [facility] Provider Note    Event Date/Time   First MD Initiated Contact with Patient 06/23/24 1045     (approximate)   History   Vaginal Bleeding   HPI  Candace Curtis is a 28 y.o. female with history of abnormal uterine bleeding, BV, presents emergency department with vaginal bleeding and fatigue.  Patient started her menstrual cycle on 11/10 and has been having bleeding since then.  States not very heavy and just a constant amount of bleeding.  Is not going through more than 1 pad per hour.  Patient states she will see it when she wipes and sometimes in the toilet.  No history of kidney stones.  No abdominal pain.  Does have an appointment with her GYN for 26 December      Physical Exam   Triage Vital Signs: ED Triage Vitals  Encounter Vitals Group     BP 06/23/24 1002 (!) 152/109     Girls Systolic BP Percentile --      Girls Diastolic BP Percentile --      Boys Systolic BP Percentile --      Boys Diastolic BP Percentile --      Pulse Rate 06/23/24 1002 (!) 125     Resp 06/23/24 1002 18     Temp 06/23/24 1002 98.8 F (37.1 C)     Temp Source 06/23/24 1002 Oral     SpO2 06/23/24 1002 100 %     Weight 06/23/24 1004 120 lb (54.4 kg)     Height 06/23/24 1004 5' 2 (1.575 m)     Head Circumference --      Peak Flow --      Pain Score 06/23/24 1002 9     Pain Loc --      Pain Education --      Exclude from Growth Chart --     Most recent vital signs: Vitals:   06/23/24 1002  BP: (!) 152/109  Pulse: (!) 125  Resp: 18  Temp: 98.8 F (37.1 C)  SpO2: 100%     General: Awake, no distress.  Patient appears well CV:  Good peripheral perfusion.  Resp:  Normal effort.  Abd:  No distention.  Nontender Other:     ED Results / Procedures / Treatments   Labs (all labs ordered are listed, but only abnormal results are displayed) Labs Reviewed  CBC WITH DIFFERENTIAL/PLATELET - Abnormal; Notable for the following components:      Result  Value   RBC 3.81 (*)    MCV 101.0 (*)    All other components within normal limits  URINALYSIS, ROUTINE W REFLEX MICROSCOPIC - Abnormal; Notable for the following components:   Color, Urine AMBER (*)    APPearance CLOUDY (*)    Hgb urine dipstick LARGE (*)    Ketones, ur 20 (*)    Protein, ur 100 (*)    Bacteria, UA MANY (*)    All other components within normal limits  URINE CULTURE  PREGNANCY, URINE  POC URINE PREG, ED  TYPE AND SCREEN     EKG     RADIOLOGY Ultrasound of the pelvis complete    PROCEDURES:   Procedures  Critical Care:  no Chief Complaint  Patient presents with   Vaginal Bleeding      MEDICATIONS ORDERED IN ED: Medications - No data to display   IMPRESSION / MDM / ASSESSMENT AND PLAN / ED COURSE  I reviewed the triage vital signs and the  nursing notes.                              Differential diagnosis includes, but is not limited to, abnormal uterine bleeding, hemorrhage, fibroid, UTI, kidney stone  Patient's presentation is most consistent with acute illness / injury with system symptoms.   Patient's labs are reassuring, however the urinalysis does show large amount of bacteria, some mucus and hyaline cast.  Ultrasound pelvis complete, independent review and interpretation by me as being negative for any acute abnormality.  Radiologist comments that the endometrium is 11 mm with some amount of fluid.  I did explain all of these findings to the patient.  Due to the many bacteria along with hyaline cast we will go ahead and treat her for possible UTI, and as patient is only seeing it on the toilet paper and in the toilet.  States not bleeding onto a pad.  Ordered urine culture due to the bacteria.  Started her on Keflex for possible UTI, and Provera 10 mg for 10 days for vaginal bleeding.  Strict instructions to follow-up with her GYN doctor.  Return emergency department worsening.  She is in agreement with treatment plan.  Discharged  stable condition.      FINAL CLINICAL IMPRESSION(S) / ED DIAGNOSES   Final diagnoses:  Abnormal vaginal bleeding  Hematuria, unspecified type     Rx / DC Orders   ED Discharge Orders          Ordered    cephALEXin (KEFLEX) 500 MG capsule  3 times daily        06/23/24 1308    medroxyPROGESTERone (PROVERA) 10 MG tablet  Daily        06/23/24 1308             Note:  This document was prepared using Dragon voice recognition software and may include unintentional dictation errors.    Gasper Devere ORN, PA-C 06/23/24 1319    Waymond Lorelle Cummins, MD 06/23/24 [phone]

## 2024-06-24 LAB — URINE CULTURE: Culture: <10000 — AB
